# Patient Record
Sex: Female | Born: 1940 | Race: White | Hispanic: No | Marital: Married | Smoking: Former smoker
Health system: Southern US, Community
[De-identification: ages and names within clinical notes are randomized; demographics above are authoritative.]

## PROBLEM LIST (undated history)

## (undated) DIAGNOSIS — T7840XA Allergy, unspecified, initial encounter: Secondary | ICD-10-CM

## (undated) DIAGNOSIS — I809 Phlebitis and thrombophlebitis of unspecified site: Secondary | ICD-10-CM

## (undated) DIAGNOSIS — T8859XA Other complications of anesthesia, initial encounter: Secondary | ICD-10-CM

## (undated) DIAGNOSIS — M79606 Pain in leg, unspecified: Secondary | ICD-10-CM

## (undated) DIAGNOSIS — R112 Nausea with vomiting, unspecified: Secondary | ICD-10-CM

## (undated) DIAGNOSIS — T4145XA Adverse effect of unspecified anesthetic, initial encounter: Secondary | ICD-10-CM

## (undated) DIAGNOSIS — C50211 Malignant neoplasm of upper-inner quadrant of right female breast: Secondary | ICD-10-CM

## (undated) DIAGNOSIS — R0602 Shortness of breath: Secondary | ICD-10-CM

## (undated) DIAGNOSIS — R51 Headache: Secondary | ICD-10-CM

## (undated) DIAGNOSIS — M1990 Unspecified osteoarthritis, unspecified site: Secondary | ICD-10-CM

## (undated) DIAGNOSIS — C50919 Malignant neoplasm of unspecified site of unspecified female breast: Secondary | ICD-10-CM

## (undated) DIAGNOSIS — G8929 Other chronic pain: Secondary | ICD-10-CM

## (undated) DIAGNOSIS — Z9889 Other specified postprocedural states: Secondary | ICD-10-CM

## (undated) DIAGNOSIS — J449 Chronic obstructive pulmonary disease, unspecified: Secondary | ICD-10-CM

## (undated) HISTORY — PX: BREAST SURGERY: SHX581

## (undated) HISTORY — DX: Malignant neoplasm of upper-inner quadrant of right female breast: C50.211

## (undated) HISTORY — PX: APPENDECTOMY: SHX54

## (undated) HISTORY — PX: VARICOSE VEIN SURGERY: SHX832

## (undated) HISTORY — PX: TONSILLECTOMY: SUR1361

## (undated) HISTORY — DX: Shortness of breath: R06.02

## (undated) HISTORY — DX: Pain in leg, unspecified: M79.606

## (undated) HISTORY — DX: Unspecified osteoarthritis, unspecified site: M19.90

## (undated) HISTORY — DX: Other chronic pain: G89.29

## (undated) HISTORY — DX: Malignant neoplasm of unspecified site of unspecified female breast: C50.919

## (undated) HISTORY — DX: Chronic obstructive pulmonary disease, unspecified: J44.9

## (undated) HISTORY — DX: Phlebitis and thrombophlebitis of unspecified site: I80.9

## (undated) HISTORY — DX: Headache: R51

---

## 1997-10-14 ENCOUNTER — Other Ambulatory Visit: Admission: RE | Admit: 1997-10-14 | Discharge: 1997-10-14 | Payer: Self-pay | Admitting: *Deleted

## 2000-12-19 ENCOUNTER — Other Ambulatory Visit: Admission: RE | Admit: 2000-12-19 | Discharge: 2000-12-19 | Payer: Self-pay | Admitting: *Deleted

## 2001-01-22 ENCOUNTER — Encounter: Payer: Self-pay | Admitting: *Deleted

## 2001-01-22 ENCOUNTER — Encounter: Admission: RE | Admit: 2001-01-22 | Discharge: 2001-01-22 | Payer: Self-pay | Admitting: *Deleted

## 2001-01-23 ENCOUNTER — Encounter: Admission: RE | Admit: 2001-01-23 | Discharge: 2001-01-23 | Payer: Self-pay | Admitting: *Deleted

## 2001-01-23 ENCOUNTER — Encounter: Payer: Self-pay | Admitting: *Deleted

## 2001-05-23 ENCOUNTER — Ambulatory Visit (HOSPITAL_COMMUNITY): Admission: RE | Admit: 2001-05-23 | Discharge: 2001-05-23 | Payer: Self-pay | Admitting: Gastroenterology

## 2001-07-11 ENCOUNTER — Encounter: Payer: Self-pay | Admitting: *Deleted

## 2001-07-11 ENCOUNTER — Encounter: Admission: RE | Admit: 2001-07-11 | Discharge: 2001-07-11 | Payer: Self-pay | Admitting: *Deleted

## 2002-07-25 ENCOUNTER — Other Ambulatory Visit: Admission: RE | Admit: 2002-07-25 | Discharge: 2002-07-25 | Payer: Self-pay | Admitting: *Deleted

## 2002-09-03 ENCOUNTER — Encounter: Admission: RE | Admit: 2002-09-03 | Discharge: 2002-09-03 | Payer: Self-pay | Admitting: *Deleted

## 2004-07-01 ENCOUNTER — Encounter: Admission: RE | Admit: 2004-07-01 | Discharge: 2004-07-01 | Payer: Self-pay | Admitting: Family Medicine

## 2006-02-14 ENCOUNTER — Encounter: Admission: RE | Admit: 2006-02-14 | Discharge: 2006-02-14 | Payer: Self-pay | Admitting: Family Medicine

## 2006-02-20 ENCOUNTER — Encounter: Admission: RE | Admit: 2006-02-20 | Discharge: 2006-02-20 | Payer: Self-pay | Admitting: Family Medicine

## 2006-06-15 ENCOUNTER — Encounter: Admission: RE | Admit: 2006-06-15 | Discharge: 2006-06-15 | Payer: Self-pay | Admitting: Family Medicine

## 2006-07-14 LAB — HM COLONOSCOPY

## 2007-05-21 ENCOUNTER — Encounter: Admission: RE | Admit: 2007-05-21 | Discharge: 2007-05-21 | Payer: Self-pay | Admitting: Family Medicine

## 2008-07-01 ENCOUNTER — Encounter: Admission: RE | Admit: 2008-07-01 | Discharge: 2008-07-01 | Payer: Self-pay | Admitting: Family Medicine

## 2010-05-05 ENCOUNTER — Other Ambulatory Visit: Payer: Self-pay | Admitting: Family Medicine

## 2010-05-05 DIAGNOSIS — Z1231 Encounter for screening mammogram for malignant neoplasm of breast: Secondary | ICD-10-CM

## 2010-05-05 DIAGNOSIS — Z1239 Encounter for other screening for malignant neoplasm of breast: Secondary | ICD-10-CM

## 2010-05-10 ENCOUNTER — Ambulatory Visit
Admission: RE | Admit: 2010-05-10 | Discharge: 2010-05-10 | Payer: Self-pay | Source: Home / Self Care | Attending: Vascular Surgery | Admitting: Vascular Surgery

## 2010-05-10 ENCOUNTER — Ambulatory Visit: Admit: 2010-05-10 | Payer: Self-pay | Admitting: Vascular Surgery

## 2010-05-10 NOTE — Assessment & Plan Note (Signed)
OFFICE VISIT  Geske, Tenea A DOB:  08-Feb-1941                                       05/10/2010 [id number]#:[id number]  This is a new patient consultation.  The patient is a 70 year old female referred for severe venous insufficiency of both lower extremities with a recent episode of superficial thrombophlebitis in her right calf. This patient has had varicose veins for 40 years which have become increasingly symptomatic, causing aching, throbbing and burning discomfort in both legs the more she is on her feet.  She had an episode of superficial thrombophlebitis which occurred in December of 2011 and has been treated by short leg elastic compression stockings and aspirin with gradual improvement but not a resolution.  She has never had treatment for her further varicosities, has no history of stasis ulcers or bleeding but does develop some edema in the ankles as the day progresses.  Symptoms have been worsening over the years to the point that she requires elevation of the legs at times to relieve her symptoms.  CHRONIC MEDICAL PROBLEMS: 1. COPD. 2. Glaucoma. 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 4. Negative for coronary artery disease, diabetes, hypertension,     hyperlipidemia or stroke.  SOCIAL HISTORY:  She is married, has 3 children, is retired.  Has not smoked cigarettes in 20 years.  Drinks occasional alcohol.  FAMILY HISTORY:  Positive for coronary artery disease in her father who died at age 84.  Positive for stroke in a grandfather.  Negative for diabetes.  REVIEW OF SYSTEMS:  Positive for headaches, changes in hearing.  Denies any claudication symptoms in her legs.  States she could walk a mile if she so chose.  Does have arthritis joint pain, dyspnea on exertion.  All other systems in a complete review of systems are negative.  PHYSICAL EXAM:  Vital signs:  Blood pressure 147/79, heart rate 71, respirations 18.  General:  She is a  well-developed, well-nourished female in no apparent distress, alert and oriented x3.  HEENT:  Normal for age.  EOMs intact.  Lungs:  Clear to auscultation.  No rhonchi or wheezing.  Cardiovascular:  Reveals regular rhythm.  No murmurs. Carotid pulses are 3+, no audible bruits.  Abdomen:  Soft, nontender with no masses.  Musculoskeletal:  Free of major deformities. Neurologic:  Normal.  Skin:  Reveals dry scaly skin in both lower extremities.  She has bulging varicosities in the left leg beginning at the proximal thigh extending anteriorly across the thigh anterior to the knee and down in the pretibial region to the ankle.  Right leg has superficial thrombophlebitis involving bulging varicosities in the right medial calf and the right posterior calf.  She has some hyperpigmentation in both lower extremities with no edema and no active ulceration.  She has 3+ femoral, 2+ popliteal and 1 to 2+ posterior tibial pulses bilaterally.  Today I ordered venous duplex exam which I reviewed and interpreted. Right leg has gross reflux in the right great saphenous vein from the saphenofemoral junction to the calf.  Deep system has reflux but no DVT. Left leg has similarly gross reflux in the great saphenous vein from the knee to the saphenofemoral junction with large tortuous varicosities emanating from this.  This patient has severe venous insufficiency in both legs and does need treatment.  We will treat her initially with long leg elastic  compression stockings (20 mm - 30 mm gradient) as well as elevation and ibuprofen.  She will return in 3 months.  If there has been no improvement she should have: 1. Laser ablation of the right great saphenous vein. 2. Laser ablation of the left great saphenous vein. 3. Return in 3 months for probable staged stab phlebectomies in both     lower extremities.  She will return in 3 months for further evaluation to see if she has had any  improvement.    Quita Skye Hart [place], M.D. Electronically Signed  JDL/MEDQ  D:  05/10/2010  T:  05/10/2010  Job:  [id number]  cc:   Elizabeth Palau, NP

## 2010-05-13 ENCOUNTER — Ambulatory Visit: Payer: Self-pay

## 2010-05-17 NOTE — Procedures (Unsigned)
LOWER EXTREMITY VENOUS REFLUX EXAM  INDICATION:  Varicose veins with pain and superficial clot in right calf 12/11.  EXAM:  Using color-flow imaging and pulse Doppler spectral analysis, the right and left common femoral, superficial femoral, popliteal, posterior tibial, greater and lesser saphenous veins were evaluated.  There is evidence suggesting deep venous insufficiency in the right and left lower extremities.  The right and left saphenofemoral junctions are not competent with reflux of >500 milliseconds.  The right and left GSVs are not competent with reflux of >500 milliseconds with the caliber as described below.  The right and left proximal short saphenous veins demonstrate competency.  GSV Diameter (used if found to be incompetent only)                                           Right    Left Proximal Greater Saphenous Vein           1.43 cm  1.1 cm Proximal-to-mid-thigh                     cm       cm Mid thigh                                 0.75 cm  0.46 cm Mid-distal thigh                          cm       cm Distal thigh                              0.76 cm  0.44 cm Knee                                      0.7 cm   0.48 cm  IMPRESSION: 1. Right and left greater saphenous veins are not competent with     reflux of >500 milliseconds. 2. Right and left greater saphenous veins are not tortuous. 3. The deep venous system is not competent with reflux of >500     milliseconds. 4. The right and left short saphenous veins are competent.           ___________________________________________ Quita Skye. [facility] [place], M.D.  AS/MEDQ  D:  05/10/2010  T:  05/10/2010  Job:  [id number]

## 2010-07-05 ENCOUNTER — Ambulatory Visit: Payer: Self-pay

## 2010-07-07 ENCOUNTER — Ambulatory Visit: Payer: Self-pay

## 2010-07-29 ENCOUNTER — Other Ambulatory Visit: Payer: Self-pay | Admitting: Family Medicine

## 2010-08-03 ENCOUNTER — Ambulatory Visit: Payer: Self-pay | Admitting: Vascular Surgery

## 2010-08-03 ENCOUNTER — Ambulatory Visit (INDEPENDENT_AMBULATORY_CARE_PROVIDER_SITE_OTHER): Payer: Medicare Other | Admitting: Vascular Surgery

## 2010-08-03 DIAGNOSIS — I83893 Varicose veins of bilateral lower extremities with other complications: Secondary | ICD-10-CM

## 2010-08-04 ENCOUNTER — Ambulatory Visit
Admission: RE | Admit: 2010-08-04 | Discharge: 2010-08-04 | Disposition: A | Discharge status: Home / Self Care | Payer: Medicare Other | Source: Ambulatory Visit | Attending: Family Medicine | Admitting: Family Medicine

## 2010-08-04 DIAGNOSIS — Z1231 Encounter for screening mammogram for malignant neoplasm of breast: Secondary | ICD-10-CM

## 2010-08-04 NOTE — Assessment & Plan Note (Signed)
OFFICE VISIT  Pierpoint, Teren A DOB:  07/05/1940                                       08/03/2010 [id number]#:[id number]  Patient returns today for follow-up regarding her venous insufficiency of both lower extremities.  She was evaluated by me in January for this problem, having had a recent episode of superficial thrombophlebitis in her right calf.  She was found to have severe reflux in both great saphenous systems.  No reflux in the small saphenous system.  She had been having aching, throbbing, and burning discomfort in both legs over the last few years, which have increased in severity, but no history of DVT.  She has been wearing her long-leg elastic compression stockings (20-30 mm gradient) as well as trying elevation and ibuprofen on a regular basis with no improvement in her symptoms.  Her thrombophlebitis has calmed down so that there is less tenderness in the right calf.  On exam today, her blood pressure is 185/82.  Heart rate is 108. Respirations are 24.  Right calf has some mild tenderness posteriorly where the thrombophlebitis had occurred.  She has bulging varicosities in the left leg beginning in the mid thigh, extending down into the pretibial region, which are quite tense and large.  Today I visualized both great saphenous veins and again they reveal gross reflux throughout.  I think the best plan for this lady to treat her symptoms would be:  1. Laser ablation of the right great saphenous vein.  2.  Laser ablation of the left great saphenous vein.  3.  Return in 3 months to be evaluated for possible stab phlebectomies.  We will proceed in the near future with her treatment plan.    Quita Skye Hart [place], M.D. Electronically Signed  JDL/MEDQ  D:  08/03/2010  T:  08/04/2010  Job:  [id number]

## 2010-08-27 NOTE — Op Note (Signed)
. [facility]  Patient:    REFUGIO, MCCONICO Visit Number: [id number] MRN: [id number]          Service Type: END Location: ENDO Attending Physician:  Charna Elizabeth Dictated by:   Anselmo Rod, M.D. Proc. Date: 05/23/01   CC:         Heather Roberts, M.D.   Operative Report  DATE OF BIRTH:  01/28/1941  REFERRING PHYSICIAN:  Heather Roberts, M.D.  PROCEDURE PERFORMED:  Flexible sigmoidoscopy up to 90 cm.  ENDOSCOPIST:  Anselmo Rod, M.D.  INSTRUMENT USED:  Olympus video colonoscope.  INDICATIONS FOR PROCEDURE:  Screening flexible sigmoidoscopy being performed in a 70 year old white female rule out colonic polyps, masses, etc.  PREPROCEDURE PREPARATION:  Informed consent was procured from the patient. The patient was fasted for eight hours prior to the procedure and prepped with two bottles of Fleets Phospho-Soda prior to the procedure.  PREPROCEDURE PHYSICAL:  The patient had stable vital signs.  Neck supple. Chest clear to auscultation.  S1, S2 regular.  Abdomen soft with normal bowel sounds.  DESCRIPTION OF PROCEDURE:  The patient was placed in the left lateral decubitus position and sedated with 50 mg of Demerol and 5 mg of Versed intravenously.  Once the patient was adequately sedated and maintained on low-flow oxygen and continuous cardiac monitoring, the Olympus video colonoscope was advanced from the rectum to 90 cm without difficulty.  No masses, polyps, erosions, ulcers or diverticula were seen.  There was a large amount of stool at 90 cm and the scope could not be advanced beyond that point.  Small internal hemorrhoids were appreciated on retroflexion in the rectum.  The patient tolerated the procedure well without complications.  IMPRESSION:   Healthy-appearing colon up to 90 cm except for small nonbleeding internal hemorrhoids.  RECOMMENDATIONS: 1. Repeat colorectal cancer screening is recommended in the next five  years    unless the patient were to develop any abnormal symptoms in the interim. 2. A high fiber diet has been advised along with liberal fluid intake.Dictated y:   Anselmo Rod, M.D. Attending Physician:  Charna Elizabeth DD:  05/23/01 TD:  05/23/01 Job: [id number]

## 2010-09-13 ENCOUNTER — Other Ambulatory Visit (INDEPENDENT_AMBULATORY_CARE_PROVIDER_SITE_OTHER): Payer: Medicare Other | Admitting: Vascular Surgery

## 2010-09-13 DIAGNOSIS — I83893 Varicose veins of bilateral lower extremities with other complications: Secondary | ICD-10-CM

## 2010-09-13 NOTE — Assessment & Plan Note (Signed)
OFFICE VISIT  Fuller, Kelsey A DOB:  Jul 31, 1940                                       09/13/2010 [id number]#:[id number]  Patient had laser ablation of her right great saphenous vein from the proximal calf to the saphenofemoral junction under local tumescent anesthesia.  She tolerated the procedure well.  She will return in 1 week for a venous duplex exam to confirm closure of the right great saphenous vein.    Quita Skye Hart [place], M.D. Electronically Signed  JDL/MEDQ  D:  09/13/2010  T:  09/13/2010  Job:  [id number]

## 2010-09-21 ENCOUNTER — Encounter (INDEPENDENT_AMBULATORY_CARE_PROVIDER_SITE_OTHER): Payer: Medicare Other

## 2010-09-21 ENCOUNTER — Ambulatory Visit (INDEPENDENT_AMBULATORY_CARE_PROVIDER_SITE_OTHER): Payer: Medicare Other | Admitting: Vascular Surgery

## 2010-09-21 DIAGNOSIS — I83893 Varicose veins of bilateral lower extremities with other complications: Secondary | ICD-10-CM

## 2010-09-21 DIAGNOSIS — Z48812 Encounter for surgical aftercare following surgery on the circulatory system: Secondary | ICD-10-CM

## 2010-09-21 NOTE — Assessment & Plan Note (Signed)
OFFICE VISIT  Allegretto, Kelsey Fuller DOB:  Jan 30, 1941                                       09/21/2010 [id number]#:[id number]  Patient returns 1 week post laser ablation of her right great saphenous vein for painful varicosities secondary to valvular reflux in her right great system.  She has had no edema in the leg.  She has had some moderate discomfort and ecchymosis in the right thigh where the ablation was performed but no calf tenderness or distal swelling.  She continues to have bulging varicosities in the left leg which she is treating with long-leg elastic compression stockings and will be treated in the near future.  She denies any chest pain, dyspnea on exertion, PND, orthopnea, or hemoptysis.  CHRONIC MEDICAL PROBLEMS, STABLE: 1. COPD. 2. Glaucoma. 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  SOCIAL HISTORY:  She is married, has 3 children, is retired.  Has not smoked in 20 years.  Drinks occasional alcohol.  PHYSICAL EXAMINATION:  Blood pressure is 184/85, heart rate 71, respirations 24.  Generally, Fuller well-developed, well-nourished female in no apparent distress.  Abdomen:  Soft, nontender without masses.  Lower extremity exam on the right reveals some moderate ecchymosis in the thigh, mild tenderness along the course of the great saphenous vein.  No distal edema.  Today I ordered Fuller venous duplex exam which I reviewed and interpreted and also performed Fuller bedside SonoSite ultrasound exam.  She does have successful ablation of the right GSV with some slight bulging into the common femoral vein, which was nonocclusive.  I have asked her to continue taking her aspirin daily.  She will return in 2 weeks for Fuller similar procedure in the left leg.    Quita Skye [facility] [place], M.D. Electronically Signed  JDL/MEDQ  D:  09/21/2010  T:  09/21/2010  Job:  [id number]

## 2010-09-28 NOTE — Procedures (Unsigned)
DUPLEX DEEP VENOUS EXAM - LOWER EXTREMITY  INDICATION:  Followup right GSV ablation performed 09/13/2010.  HISTORY:  Edema:  Minimal. Trauma/Surgery:  Right ablation. Pain:  Yes. PE:  No. Previous DVT:  No. Anticoagulants: Other:  DUPLEX EXAM:               CFV   SFV   PopV  PTV    GSV               R  L  R  L  R  L  R   L  R  L Thrombosis    +     o     o     o      + Spontaneous   +     +     +     +      o Phasic        +     +     +     +      o Augmentation  +     +     +     +      o Compressible  P     +     +     +      o Competent     +     +     +     +      o  Legend:  + - yes  o - no  p - partial  D - decreased  IMPRESSION:  Successful ablation of right greater saphenous vein with slight extension of thrombus into the common femoral vein which appears nonocclusive.   _____________________________ Quita Skye. Hart [place], M.D.  LT/MEDQ  D:  09/21/2010  T:  09/21/2010  Job:  [id number]

## 2010-10-04 ENCOUNTER — Other Ambulatory Visit (INDEPENDENT_AMBULATORY_CARE_PROVIDER_SITE_OTHER): Payer: Medicare Other | Admitting: Vascular Surgery

## 2010-10-04 DIAGNOSIS — I83893 Varicose veins of bilateral lower extremities with other complications: Secondary | ICD-10-CM

## 2010-10-04 NOTE — Assessment & Plan Note (Signed)
OFFICE VISIT  Cullinan, Oceanna A DOB:  1941-04-07                                       10/05/2010 [id number]#:[id number]  The patient had laser ablation of her left great saphenous vein for painful varicosities secondary to valvular incompetence in the left great saphenous system.  She tolerated the procedure well which was done under local tumescent anesthesia.  She will return in 1 week for followup duplex scan to confirm closure of left great saphenous vein.    Quita Skye Hart [place], M.D. Electronically Signed  JDL/MEDQ  D:  10/04/2010  T:  10/04/2010  Job:  [id number]

## 2010-10-11 ENCOUNTER — Ambulatory Visit (INDEPENDENT_AMBULATORY_CARE_PROVIDER_SITE_OTHER): Payer: Medicare Other | Admitting: Vascular Surgery

## 2010-10-11 ENCOUNTER — Encounter (INDEPENDENT_AMBULATORY_CARE_PROVIDER_SITE_OTHER): Payer: Medicare Other

## 2010-10-11 DIAGNOSIS — I8310 Varicose veins of unspecified lower extremity with inflammation: Secondary | ICD-10-CM

## 2010-10-11 DIAGNOSIS — Z48812 Encounter for surgical aftercare following surgery on the circulatory system: Secondary | ICD-10-CM

## 2010-10-11 DIAGNOSIS — I83893 Varicose veins of bilateral lower extremities with other complications: Secondary | ICD-10-CM

## 2010-10-12 NOTE — Assessment & Plan Note (Signed)
OFFICE VISIT  Fuller, Kelsey A DOB:  [birth year]                                       10/11/2010 [id number]#:[id number]  The patient returns today 1 week post laser ablation of the left great saphenous vein for valvular reflux and secondary painful varicosities in the left thigh and calf and pretibial region.  She tolerated the procedure well and has been wearing elastic compression stocking and taking ibuprofen as prescribed.  She has had no distal edema in the left ankle and has noted that the severe varicosities below the knee are less tense but still quite visible.  She denies any chest pain, dyspnea on exertion, PND, orthopnea, hemoptysis or other secondary symptoms.  CHRONIC MEDICAL PROBLEMS: 1. COPD. 2. Glaucoma. 3. Diffuse arthritis and lumbar spine disease. 4. Negative for coronary artery disease, diabetes or stroke.  REVIEW OF SYSTEMS:  As noted with no chest pain, hemoptysis, dyspnea on exertion, PND, orthopnea.  PHYSICAL EXAM:  Vital signs:  Blood pressure is 174/82, heart rate 71, respirations 24.  General:  She is a thin female who is no apparent distress, alert and oriented x3.  HEENT:  Exam normal for age.  EOMs intact.  Lungs:  Clear to auscultation.  No rhonchi or wheezing. Cardiovascular:  Regular rhythm.  No murmurs.  Carotid pulses 3+.  No bruits.  Lower extremities:  Exam reveals 3+ femoral and dorsalis pedis pulses bilaterally.  Left leg has very visible varicosities beginning in the distal medial thigh extending in the pretibial region to the ankle but no distal edema is noted and these are less tense than prior to the ablation.  Today I ordered a venous duplex exam which I reviewed and interpreted. She does have complete closure of the left great saphenous vein with some bulging of the thrombus into the left common femoral vein.  I also looked at this personally with the SonoSite and it was nonocclusive.  She will continue taking  daily aspirin and return in 3 months for venous duplex exam of the left leg unless she should develop any severe swelling in the interim.  At that point we will evaluate her for possible stab phlebectomies for the secondary varicosities.    Quita Skye Hart [place], M.D. Electronically Signed  JDL/MEDQ  D:  10/11/2010  T:  10/12/2010  Job:  [id number]

## 2010-11-09 ENCOUNTER — Encounter: Payer: Self-pay | Admitting: Vascular Surgery

## 2011-01-10 ENCOUNTER — Encounter: Payer: Self-pay | Admitting: Vascular Surgery

## 2011-01-11 ENCOUNTER — Encounter: Payer: Self-pay | Admitting: Vascular Surgery

## 2011-01-11 ENCOUNTER — Encounter (INDEPENDENT_AMBULATORY_CARE_PROVIDER_SITE_OTHER): Payer: Medicare Other | Admitting: *Deleted

## 2011-01-11 ENCOUNTER — Ambulatory Visit (INDEPENDENT_AMBULATORY_CARE_PROVIDER_SITE_OTHER): Payer: Medicare Other | Admitting: Vascular Surgery

## 2011-01-11 VITALS — BP 144/74 | HR 79 | Resp 20 | Ht 63.0 in | Wt 120.0 lb

## 2011-01-11 DIAGNOSIS — Z48812 Encounter for surgical aftercare following surgery on the circulatory system: Secondary | ICD-10-CM

## 2011-01-11 DIAGNOSIS — I8310 Varicose veins of unspecified lower extremity with inflammation: Secondary | ICD-10-CM

## 2011-01-11 DIAGNOSIS — I83893 Varicose veins of bilateral lower extremities with other complications: Secondary | ICD-10-CM

## 2011-01-11 NOTE — Progress Notes (Signed)
Subjective:     Patient ID: Kelsey Fuller, female   DOB: 1940-09-11, 70 y.o.   MRN: [id number]  HPI this 70 year old female returns for continued followup regarding her bilateral venous insufficiency. She has undergone bilateral laser ablation of the great saphenous veins-right side September 13 2010 left-sided 10/04/2010. She had some thrombus slightly protruding into the common femoral vein on the left side following her left laser ablation. She has had no DVT in the past. Today I ordered a venous duplex exam which I reviewed and interpreted. There is no DVT on the left side and there is no longer thrombus protruding into the femoral vein. The majority of the GSC is closed with the exception of near the junction. He has continued to have some aching and throbbing discomfort in the calves left greater than right following her ablation procedures. Bulges are not as significant as prior to the ablation but continued to be present and pretty extensive on the left side from the thigh extending down to the ankle  Review of Systems denies chest pain, PND, orthopnea,. Does complain of dyspnea on exertion, weight gain, dizziness, headaches, change in hearing, arthritis, wheezing due to her COPD    Objective:   Physical Exam blood pressure 144/74 heart rate 79 respirations 20 General she's well-developed thin female who is no apparent stress alert oriented x3 Lower extremity exam no 3+ femoral popliteal dorsalis pedis pulses bilaterally She has bulging varicosities in the left leg beginning in the proximal thigh extending into the pretibial region extending down to the ankle. Right leg varicosities are in the posterior calf and extending down toward the medial malleolus    Assessment:    successful bilateral laser ablation greater saphenous veins-remains symptomatic with secondary varicosities. This has not been relieved by long-leg last compression stockings nor elevation and ibuprofen.    Plan:    plan greater  than 20 stab phlebectomy's left leg plus sclerotherapy of residual varicosities right leg is final procedure next week

## 2011-01-11 NOTE — Progress Notes (Signed)
3 month fu laser ablation. Duplex performed. Eval for stab phlebs.

## 2011-01-14 ENCOUNTER — Encounter: Payer: Self-pay | Admitting: Vascular Surgery

## 2011-01-17 ENCOUNTER — Encounter: Payer: Self-pay | Admitting: Vascular Surgery

## 2011-01-17 ENCOUNTER — Ambulatory Visit (INDEPENDENT_AMBULATORY_CARE_PROVIDER_SITE_OTHER): Payer: Medicare Other | Admitting: Vascular Surgery

## 2011-01-17 VITALS — BP 172/80 | HR 72 | Resp 20 | Ht 64.0 in | Wt 120.0 lb

## 2011-01-17 DIAGNOSIS — I83893 Varicose veins of bilateral lower extremities with other complications: Secondary | ICD-10-CM

## 2011-01-17 NOTE — Progress Notes (Signed)
Stab phlebs. And sclero

## 2011-01-17 NOTE — Progress Notes (Signed)
Subjective:     Patient ID: Kelsey Fuller, female   DOB: 22-Mar-1941, 70 y.o.   MRN: [id number]  HPI this 70 year old female underwent greater than 71 stab phlebectomy is in the left anterior thigh and anterior pretibial calf area. She had previously undergone laser ablation of her left great saphenous vein a few months ago. He also had sclerotherapy of secondary varicosities in the right leg today. This was all done under local medicine anesthesia on the left and no anesthesia on the right. He tolerated both procedures well   Review of Systems     Objective:   Physical Exam blood pressure 170/80 heart rate 72 respirations 20    Assessment:    successful stab phlebectomy strength is greater than 20) left leg of residual painful varicosities .successful sclerotherapy of secondary varicosities right leg    Plan:    elastic compression stockings for 2 weeks and return to see [place] for final followup in 6 weeks

## 2011-01-17 NOTE — Progress Notes (Signed)
Subjective:     Patient ID: Kelsey Fuller, female   DOB: Jan 24, 1941, 70 y.o.   MRN: [id number]  HPI  X=.3%Sotradecol administered with a 27g butterfly.  Patient received a total of 6cc foam.   Compression stockings applied: yes and ace wraps   Review of Systems     Objective:   Physical Exam     Assessment:     Injected reticular and spider veins that were too small for stab phlebectomies. Pt. Tolerated procedure well.   Plan:     She is to return in 6 weeks.

## 2011-01-18 ENCOUNTER — Telehealth: Payer: Self-pay | Admitting: *Deleted

## 2011-01-18 NOTE — Telephone Encounter (Signed)
01/18/2011  Time: 9:55 AM   Patient Name: Kelsey Fuller  Patient of: Hart Rochester  Procedure:37766 L leg and 44010 on R leg  Reached patient at home. Doing well. No bleeding. Will see her back in 6 weeks.

## 2011-01-25 ENCOUNTER — Encounter: Payer: Self-pay | Admitting: Vascular Surgery

## 2011-02-18 ENCOUNTER — Encounter: Payer: Self-pay | Admitting: Vascular Surgery

## 2011-02-21 ENCOUNTER — Encounter: Payer: Self-pay | Admitting: Vascular Surgery

## 2011-02-21 ENCOUNTER — Ambulatory Visit (INDEPENDENT_AMBULATORY_CARE_PROVIDER_SITE_OTHER): Payer: Medicare Other | Admitting: Vascular Surgery

## 2011-02-21 VITALS — BP 135/76 | HR 90 | Resp 18 | Ht 63.0 in | Wt 120.0 lb

## 2011-02-21 DIAGNOSIS — I83893 Varicose veins of bilateral lower extremities with other complications: Secondary | ICD-10-CM

## 2011-02-21 NOTE — Progress Notes (Signed)
Subjective:     Patient ID: Kelsey Fuller, female   DOB: 1940-09-01, 70 y.o.   MRN: [id number]  HPI this 70 year old female had greater than 20 stab phlebectomy performed 01/17/2011 for painful residual varicosities. She also had sclerotherapy of the right leg residual small varicosities. She states that her discomfort in the lower extremities has resolved and she is having no distal edema. She is not worn elastic compression stockings and is having no symptoms in either leg at this time.   Review of Systems     Objective:   Physical Exam blood pressure 130/76 heart rate 90 respirations 18 Lower extremity exam reveals no distal edema in either leg. She does have a few residual varicosities in the left leg around the pretibial knee area. There is no active ulceration or bulging varicosities in the medial thigh or distal calf.    Assessment:     Doing well post bilateral great saphenous vein laser ablation procedures and greater 20 stab phlebectomy left leg and sclerotherapy right leg.    Plan:     Return on when necessary basis

## 2011-03-18 ENCOUNTER — Encounter: Payer: Self-pay | Admitting: Vascular Surgery

## 2011-12-20 ENCOUNTER — Encounter: Payer: Self-pay | Admitting: Cardiovascular Disease

## 2011-12-22 ENCOUNTER — Encounter: Payer: Self-pay | Admitting: Cardiovascular Disease

## 2012-07-11 ENCOUNTER — Encounter: Payer: Medicare Other | Admitting: Cardiovascular Disease

## 2012-07-17 ENCOUNTER — Encounter: Payer: Self-pay | Admitting: Cardiovascular Disease

## 2012-07-19 ENCOUNTER — Ambulatory Visit (INDEPENDENT_AMBULATORY_CARE_PROVIDER_SITE_OTHER): Payer: Medicare Other | Admitting: Cardiovascular Disease

## 2012-07-19 ENCOUNTER — Encounter: Payer: Self-pay | Admitting: Cardiovascular Disease

## 2012-07-19 VITALS — BP 126/86 | HR 84 | Ht 63.0 in | Wt 118.4 lb

## 2012-07-19 DIAGNOSIS — R9431 Abnormal electrocardiogram [ECG] [EKG]: Secondary | ICD-10-CM

## 2012-07-19 DIAGNOSIS — R0600 Dyspnea, unspecified: Secondary | ICD-10-CM | POA: Insufficient documentation

## 2012-07-19 DIAGNOSIS — Z8249 Family history of ischemic heart disease and other diseases of the circulatory system: Secondary | ICD-10-CM | POA: Insufficient documentation

## 2012-07-19 DIAGNOSIS — J449 Chronic obstructive pulmonary disease, unspecified: Secondary | ICD-10-CM | POA: Insufficient documentation

## 2012-07-19 DIAGNOSIS — R0989 Other specified symptoms and signs involving the circulatory and respiratory systems: Secondary | ICD-10-CM

## 2012-07-19 NOTE — Progress Notes (Signed)
Kelsey Fuller Date of Birth  [birth year]       [facility]    [place] [address]  [address] [place], [place]  [address]   [place], [place]  [address] [phone]     [phone]   Fax  [phone]    Fax [phone]  Problem List: 1. Abnormal ECG 2. COPD 3. Family history of cardiomyopathy.   History of Present Illness:  Kelsey Fuller is a 72 yo who I have seen in the past.  She has chronic dyspnea that she thinks is due to COPD but has had a sister and her mother die of complications due to cardiomyopathy.    She denies any chest pain.  She is chronically short of breath.    I saw her in  2009 for evaluation of dyspnea.  She had a Lexiscan Myoview study at that time which revealed no ischemia. She had an echocardiogram which revealed normal left ventricular systolic function. She did have mild diastolic dysfunction.   Current Outpatient Prescriptions on File Prior to Visit  Medication Sig Dispense Refill  . albuterol (PROVENTIL) (2.5 MG/3ML) 0.083% nebulizer solution Take 2.5 mg by nebulization every 6 (six) hours as needed.        Marland Kitchen FLAXSEED, LINSEED, PO Take by mouth daily.       . Multiple Vitamin (MULTIVITAMIN) capsule Take 1 capsule by mouth daily.        Marland Kitchen tiotropium (SPIRIVA) 18 MCG inhalation capsule Place 18 mcg into inhaler and inhale daily.         No current facility-administered medications on file prior to visit.    Allergies  Allergen Reactions  . Codeine   . Penicillins     Past Medical History  Diagnosis Date  . Chronic headaches   . Asthma   . Arthritis   . Shortness of breath on exertion   . Leg pain   . COPD (chronic obstructive pulmonary disease)   . Osteoporosis   . Glaucoma(365)   . Thrombophlebitis     Past Surgical History  Procedure Laterality Date  . Breast surgery      History  Smoking status  . Former Smoker -- 0.50 packs/day for 20 years  . Types: Cigarettes  . Quit date:  04/12/1975  Smokeless tobacco  . Never Used    History  Alcohol Use  . 0.5 oz/week  . 1 drink(s) per week    Family History  Problem Relation Age of Onset  . Diabetes Mother   . Heart disease Mother   . Hypertension Mother   . Heart disease Father     Reviw of Systems:  Reviewed in the HPI.  All other systems are negative.  Physical Exam: Blood pressure 126/86, pulse 84, height 5\' 3"  (1.6 m), weight 118 lb 6.4 oz (53.706 kg). General: Well developed, well nourished, in no acute distress.  Head: Normocephalic, atraumatic, sclera non-icteric, mucus membranes are moist,   Neck: Supple. Carotids are 2 + without bruits. No JVD   Lungs: Clear   Heart: RR, normal S1, S2, soft systolic murmur  Abdomen: Soft, non-tender, non-distended with normal bowel sounds.  Msk:  Strength and tone are normal   Extremities: No clubbing or cyanosis. No edema.  Distal pedal pulses are 2+ and equal    Neuro: CN II - XII intact.  Alert and oriented X 3.   Psych:  Normal   ECG: 07/19/2012: Normal sinus rhythm at  83. She has ST and T wave abnormalities. She has an incomplete left bundle branch block.   Assessment / Plan:

## 2012-07-19 NOTE — Patient Instructions (Addendum)
Your physician has requested that you have a lexiscan myoview.  Please follow instruction sheet, as given.   Your physician has requested that you have an echocardiogram. Echocardiography is a painless test that uses sound waves to create images of your heart. It provides your doctor with information about the size and shape of your heart and how well your heart's chambers and valves are working. This procedure takes approximately one hour. There are no restrictions for this procedure.  Your physician wants you to follow-up in: 1 YEAR You will receive a reminder letter in the mail two months in advance. If you don't receive a letter, please call our office to schedule the follow-up appointment.   Your physician recommends that you continue on your current medications as directed. Please refer to the Current Medication list given to you today.

## 2012-07-19 NOTE — Assessment & Plan Note (Signed)
Kelsey Fuller presents today for further evaluation of her dyspnea. She also has a family history of cardiomyopathy. A tear about 5 years ago.  She presents today for further evaluation. She hasn't an abnormal EKG which reveals an incomplete left bundle branch block. She has a T-wave inversions in lead aVL and V5 and V6.  She thinks that her shortness of breath may be due to COPD but she's not clear. Her sister recently died of a cardiomyopathy. Her sister's doctor informed to rest the family that they should be checked for cardiomyopathy.  We'll schedule her for an echocardiogram for further evaluation of her left ventricular size and function. We'll also get a [employer] for further evaluation of her abnormal ECG.  I'll plan on seeing her again in one year.

## 2012-07-24 ENCOUNTER — Ambulatory Visit (HOSPITAL_COMMUNITY): Payer: Medicare Other | Attending: Cardiology

## 2012-07-24 DIAGNOSIS — R0600 Dyspnea, unspecified: Secondary | ICD-10-CM

## 2012-07-24 DIAGNOSIS — R9431 Abnormal electrocardiogram [ECG] [EKG]: Secondary | ICD-10-CM

## 2012-07-24 DIAGNOSIS — R0989 Other specified symptoms and signs involving the circulatory and respiratory systems: Secondary | ICD-10-CM | POA: Insufficient documentation

## 2012-07-24 DIAGNOSIS — R0609 Other forms of dyspnea: Secondary | ICD-10-CM

## 2012-07-24 NOTE — Progress Notes (Signed)
Echocardiogram performed.  

## 2012-07-30 ENCOUNTER — Ambulatory Visit (HOSPITAL_COMMUNITY): Payer: Medicare Other | Attending: Cardiovascular Disease | Admitting: Radiology

## 2012-07-30 VITALS — BP 159/79 | Ht 63.0 in | Wt 116.0 lb

## 2012-07-30 DIAGNOSIS — R9431 Abnormal electrocardiogram [ECG] [EKG]: Secondary | ICD-10-CM | POA: Insufficient documentation

## 2012-07-30 DIAGNOSIS — R0600 Dyspnea, unspecified: Secondary | ICD-10-CM

## 2012-07-30 DIAGNOSIS — Z87891 Personal history of nicotine dependence: Secondary | ICD-10-CM | POA: Insufficient documentation

## 2012-07-30 DIAGNOSIS — J4489 Other specified chronic obstructive pulmonary disease: Secondary | ICD-10-CM | POA: Insufficient documentation

## 2012-07-30 DIAGNOSIS — J449 Chronic obstructive pulmonary disease, unspecified: Secondary | ICD-10-CM | POA: Insufficient documentation

## 2012-07-30 DIAGNOSIS — R0602 Shortness of breath: Secondary | ICD-10-CM | POA: Insufficient documentation

## 2012-07-30 DIAGNOSIS — Z8249 Family history of ischemic heart disease and other diseases of the circulatory system: Secondary | ICD-10-CM | POA: Insufficient documentation

## 2012-07-30 MED ORDER — REGADENOSON 0.4 MG/5ML IV SOLN
0.4000 mg | Freq: Once | INTRAVENOUS | Status: AC
  Administered 2012-07-30: 0.4 mg via INTRAVENOUS

## 2012-07-30 MED ORDER — TECHNETIUM TC 99M SESTAMIBI GENERIC - CARDIOLITE
30.0000 | Freq: Once | INTRAVENOUS | Status: AC | PRN
  Administered 2012-07-30: 30 via INTRAVENOUS

## 2012-07-30 MED ORDER — TECHNETIUM TC 99M SESTAMIBI GENERIC - CARDIOLITE
10.0000 | Freq: Once | INTRAVENOUS | Status: AC | PRN
  Administered 2012-07-30: 10 via INTRAVENOUS

## 2012-07-30 NOTE — Progress Notes (Signed)
[facility] SITE 3 NUCLEAR MED [address] [place], [place] [address] [phone]    Cardiology Nuclear Med Study  Kelsey Fuller is a 72 y.o. female     MRN : [id number]     DOB: 1940/05/09  Procedure Date: 07/30/2012  Nuclear Med Background Indication for Stress Test:  Evaluation for Ischemia and Abnormal EKG History:  Asthma, COPD and 2009 MPS: (-) ischemia NL EF: 71% 07/24/12 ECHO Cardiac Risk Factors: Family History - CAD and History of Smoking  Symptoms:  SOB   Nuclear Pre-Procedure Caffeine/Decaff Intake:  None > 12 hrs NPO After: 8:00pm   Lungs:  clear O2 Sat: 97% on room air. IV 0.9% NS with Angio Cath:  20g  IV Site: R Antecubital x 1, tolerated well IV Started by:  Irean Hong, RN  Chest Size (in):  34 Cup Size: C  Height: 5\' 3"  (1.6 m)  Weight:  116 lb (52.617 kg)  BMI:  Body mass index is 20.55 kg/(m^2). Tech Comments:  n/a    Nuclear Med Study 1 or 2 day study: 1 day  Stress Test Type:  Lexiscan  Reading MD: Dietrich Pates, MD  Order Authorizing Provider:  Kristeen Miss, MD  Resting Radionuclide: Technetium 54m Sestamibi  Resting Radionuclide Dose: 10.9 mCi   Stress Radionuclide:  Technetium 61m Sestamibi  Stress Radionuclide Dose: 33.0 mCi           Stress Protocol Rest HR: 64 Stress HR: 107  Rest BP: 159/79 Stress BP: 153/66  Exercise Time (min): n/a METS: n/a   Predicted Max HR: 147 bpm % Max HR: 72.79 bpm Rate Pressure Product: 41324   Dose of Adenosine (mg):  n/a Dose of Lexiscan: 0.4 mg  Dose of Atropine (mg): n/a Dose of Dobutamine: n/a mcg/kg/min (at max HR)  Stress Test Technologist: Milana Na, EMT-P  Nuclear Technologist:  Domenic Polite, CNMT     Rest Procedure:  Myocardial perfusion imaging was performed at rest 45 minutes following the intravenous administration of Technetium 42m Sestamibi. Rest ECG: NST.  T wave inversion V1 to V4.  Stress Procedure:  The patient received IV Lexiscan 0.4 mg over 15-seconds.   Technetium 64m Sestamibi injected at 30-seconds. This patient was sob, lt. Headed, legs were weak, and had a headache with the Lexiscan injection. Quantitative spect images were obtained after a 45 minute delay. Stress ECG: Rate induced LBBB.  Normalized in recovery.  Overall nondiagnostic.  QPS Raw Data Images: Soft tissue (diaphragm, breast) surround heart Stress Images:  Normal homogeneous uptake in all areas of the myocardium. Rest Images:  Normal homogeneous uptake in all areas of the myocardium. Subtraction (SDS):  No evidence of ischemia. Transient Ischemic Dilatation (Normal <1.22):  1.02 Lung/Heart Ratio (Normal <0.45):  0.25  Quantitative Gated Spect Images QGS EDV:  71 ml QGS ESV:  27 ml  Impression Exercise Capacity:  Lexiscan with no exercise. BP Response:  Normal blood pressure response. Clinical Symptoms:  No chest pain. ECG Impression:  Intermitt incomplete LBBB Comparison with Prior Nuclear Study:  No change from previous exam.  Overall Impression:  Normal stress nuclear study.  LV Ejection Fraction: 62%.  LV Wall Motion:  NL LV Function; NL Wall Motion  Dietrich Pates

## 2013-04-19 ENCOUNTER — Other Ambulatory Visit: Payer: Self-pay | Admitting: Physician Assistant

## 2013-04-23 ENCOUNTER — Other Ambulatory Visit: Payer: Self-pay | Admitting: Physician Assistant

## 2013-04-23 DIAGNOSIS — M8580 Other specified disorders of bone density and structure, unspecified site: Secondary | ICD-10-CM

## 2013-05-02 DIAGNOSIS — M545 Low back pain, unspecified: Secondary | ICD-10-CM | POA: Diagnosis not present

## 2013-05-02 DIAGNOSIS — IMO0002 Reserved for concepts with insufficient information to code with codable children: Secondary | ICD-10-CM | POA: Diagnosis not present

## 2013-05-07 DIAGNOSIS — M545 Low back pain, unspecified: Secondary | ICD-10-CM | POA: Diagnosis not present

## 2013-05-07 DIAGNOSIS — IMO0002 Reserved for concepts with insufficient information to code with codable children: Secondary | ICD-10-CM | POA: Diagnosis not present

## 2013-05-08 ENCOUNTER — Ambulatory Visit
Admission: RE | Admit: 2013-05-08 | Discharge: 2013-05-08 | Disposition: A | Discharge status: Home / Self Care | Payer: Medicare Other | Source: Ambulatory Visit | Attending: Physician Assistant | Admitting: Physician Assistant

## 2013-05-08 DIAGNOSIS — M949 Disorder of cartilage, unspecified: Secondary | ICD-10-CM | POA: Diagnosis not present

## 2013-05-08 DIAGNOSIS — M899 Disorder of bone, unspecified: Secondary | ICD-10-CM | POA: Diagnosis not present

## 2013-05-08 DIAGNOSIS — M8580 Other specified disorders of bone density and structure, unspecified site: Secondary | ICD-10-CM

## 2013-05-09 DIAGNOSIS — IMO0002 Reserved for concepts with insufficient information to code with codable children: Secondary | ICD-10-CM | POA: Diagnosis not present

## 2013-05-09 DIAGNOSIS — M545 Low back pain, unspecified: Secondary | ICD-10-CM | POA: Diagnosis not present

## 2013-05-14 DIAGNOSIS — M545 Low back pain, unspecified: Secondary | ICD-10-CM | POA: Diagnosis not present

## 2013-05-14 DIAGNOSIS — IMO0002 Reserved for concepts with insufficient information to code with codable children: Secondary | ICD-10-CM | POA: Diagnosis not present

## 2013-05-16 DIAGNOSIS — M545 Low back pain, unspecified: Secondary | ICD-10-CM | POA: Diagnosis not present

## 2013-05-16 DIAGNOSIS — IMO0002 Reserved for concepts with insufficient information to code with codable children: Secondary | ICD-10-CM | POA: Diagnosis not present

## 2013-05-20 DIAGNOSIS — N390 Urinary tract infection, site not specified: Secondary | ICD-10-CM | POA: Diagnosis not present

## 2013-05-21 DIAGNOSIS — IMO0002 Reserved for concepts with insufficient information to code with codable children: Secondary | ICD-10-CM | POA: Diagnosis not present

## 2013-05-21 DIAGNOSIS — M545 Low back pain, unspecified: Secondary | ICD-10-CM | POA: Diagnosis not present

## 2013-05-23 DIAGNOSIS — M545 Low back pain, unspecified: Secondary | ICD-10-CM | POA: Diagnosis not present

## 2013-05-23 DIAGNOSIS — IMO0002 Reserved for concepts with insufficient information to code with codable children: Secondary | ICD-10-CM | POA: Diagnosis not present

## 2013-05-27 DIAGNOSIS — M5380 Other specified dorsopathies, site unspecified: Secondary | ICD-10-CM | POA: Diagnosis not present

## 2013-05-27 DIAGNOSIS — M545 Low back pain, unspecified: Secondary | ICD-10-CM | POA: Diagnosis not present

## 2013-05-27 DIAGNOSIS — M48061 Spinal stenosis, lumbar region without neurogenic claudication: Secondary | ICD-10-CM | POA: Diagnosis not present

## 2013-06-08 DIAGNOSIS — IMO0002 Reserved for concepts with insufficient information to code with codable children: Secondary | ICD-10-CM | POA: Diagnosis not present

## 2013-06-08 DIAGNOSIS — M545 Low back pain, unspecified: Secondary | ICD-10-CM | POA: Diagnosis not present

## 2013-06-11 DIAGNOSIS — IMO0002 Reserved for concepts with insufficient information to code with codable children: Secondary | ICD-10-CM | POA: Diagnosis not present

## 2013-06-11 DIAGNOSIS — M545 Low back pain, unspecified: Secondary | ICD-10-CM | POA: Diagnosis not present

## 2013-06-13 DIAGNOSIS — M545 Low back pain, unspecified: Secondary | ICD-10-CM | POA: Diagnosis not present

## 2013-06-13 DIAGNOSIS — IMO0002 Reserved for concepts with insufficient information to code with codable children: Secondary | ICD-10-CM | POA: Diagnosis not present

## 2013-06-18 DIAGNOSIS — M545 Low back pain, unspecified: Secondary | ICD-10-CM | POA: Diagnosis not present

## 2013-06-18 DIAGNOSIS — IMO0002 Reserved for concepts with insufficient information to code with codable children: Secondary | ICD-10-CM | POA: Diagnosis not present

## 2013-06-20 DIAGNOSIS — IMO0002 Reserved for concepts with insufficient information to code with codable children: Secondary | ICD-10-CM | POA: Diagnosis not present

## 2013-06-20 DIAGNOSIS — M545 Low back pain, unspecified: Secondary | ICD-10-CM | POA: Diagnosis not present

## 2013-07-26 DIAGNOSIS — M5137 Other intervertebral disc degeneration, lumbosacral region: Secondary | ICD-10-CM | POA: Diagnosis not present

## 2013-07-26 DIAGNOSIS — M545 Low back pain, unspecified: Secondary | ICD-10-CM | POA: Diagnosis not present

## 2013-07-26 DIAGNOSIS — M48061 Spinal stenosis, lumbar region without neurogenic claudication: Secondary | ICD-10-CM | POA: Diagnosis not present

## 2013-07-26 DIAGNOSIS — IMO0002 Reserved for concepts with insufficient information to code with codable children: Secondary | ICD-10-CM | POA: Diagnosis not present

## 2013-08-07 ENCOUNTER — Encounter: Payer: Self-pay | Admitting: Cardiovascular Disease

## 2013-08-07 ENCOUNTER — Ambulatory Visit (INDEPENDENT_AMBULATORY_CARE_PROVIDER_SITE_OTHER): Payer: Medicare Other | Admitting: Cardiovascular Disease

## 2013-08-07 VITALS — BP 114/75 | HR 71 | Ht 63.0 in | Wt 116.4 lb

## 2013-08-07 DIAGNOSIS — R0609 Other forms of dyspnea: Secondary | ICD-10-CM | POA: Diagnosis not present

## 2013-08-07 DIAGNOSIS — R0989 Other specified symptoms and signs involving the circulatory and respiratory systems: Secondary | ICD-10-CM

## 2013-08-07 DIAGNOSIS — R0600 Dyspnea, unspecified: Secondary | ICD-10-CM

## 2013-08-07 NOTE — Patient Instructions (Signed)
Your physician recommends that you continue on your current medications as directed. Please refer to the Current Medication list given to you today.  Your physician recommends that you schedule a follow-up appointment in: as needed with Dr. Nahser  

## 2013-08-07 NOTE — Assessment & Plan Note (Signed)
Kelsey Fuller presents today for followup visit. Her Myoview study revealed normal left ventricular function with no evidence of ischemia. Her echocardiogram revealed mild diastolic dysfunction he just trivial valvular disease.  I suspect her shortness breath is mostly due to her COPD. The amount of diastolic dysfunction but she has most likely related to her age. Her blood pressure is normal. There is no real treatment that we have for this at this point.  I'll see her on an as-needed basis. She'll continue to followup with her general medical Dr.

## 2013-08-07 NOTE — Progress Notes (Signed)
Kelsey Fuller Date of Birth  1940/04/27       [facility]    [employer] [address] [place], [place], [place]  [address]   [place], [place]  [address] [phone]     [phone]   Fax  [phone]    Fax [phone]  Problem List: 1. Abnormal ECG 2. COPD 3. Family history of cardiomyopathy.   History of Present Illness:  Kelsey Fuller is a 73 yo who I have seen in the past.  She has chronic dyspnea that she thinks is due to COPD but has had a sister and her mother die of complications due to cardiomyopathy.    She denies any chest pain.  She is chronically short of breath.    I saw her in  2009 for evaluation of dyspnea.  She had a Lexiscan Myoview study at that time which revealed no ischemia. She had an echocardiogram which revealed normal left ventricular systolic function. She did have mild diastolic dysfunction.  August 07, 2013:  Kelsey Fuller is doing OK. No complaints.    She has had some atypical CP and dyspnea myoview in 2014 showed-- Normal stress nuclear study.  LV Ejection Fraction: 62%.     Current Outpatient Prescriptions on File Prior to Visit  Medication Sig Dispense Refill  . albuterol (PROVENTIL) (2.5 MG/3ML) 0.083% nebulizer solution Take 2.5 mg by nebulization every 6 (six) hours as needed.        Marland Kitchen aspirin 81 MG tablet Take 81 mg by mouth daily.      . B Complex-C (SUPER B COMPLEX PO) Take by mouth daily.      Marland Kitchen FLAXSEED, LINSEED, PO Take by mouth daily.       . Multiple Vitamin (MULTIVITAMIN) capsule Take 1 capsule by mouth daily.        . NON FORMULARY Lanaprost Eye Drop 1 Daily      . tiotropium (SPIRIVA) 18 MCG inhalation capsule Place 18 mcg into inhaler and inhale daily.         No current facility-administered medications on file prior to visit.    Allergies  Allergen Reactions  . Codeine   . Penicillins     Past Medical History  Diagnosis Date  . Chronic headaches   . Asthma   . Arthritis    . Shortness of breath on exertion   . Leg pain   . COPD (chronic obstructive pulmonary disease)   . Osteoporosis   . Glaucoma   . Thrombophlebitis     Past Surgical History  Procedure Laterality Date  . Breast surgery      History  Smoking status  . Former Smoker -- 0.50 packs/day for 20 years  . Types: Cigarettes  . Quit date: 04/12/1975  Smokeless tobacco  . Never Used    History  Alcohol Use  . 0.5 oz/week  . 1 drink(s) per week    Family History  Problem Relation Age of Onset  . Diabetes Mother   . Heart disease Mother   . Hypertension Mother   . Heart disease Father     Reviw of Systems:  Reviewed in the HPI.  All other systems are negative.  Physical Exam: Blood pressure 114/75, pulse 71, height 5\' 3"  (1.6 m), weight 116 lb 6.4 oz (52.799 kg). General: Well developed, well nourished, in no acute distress.  Head: Normocephalic, atraumatic, sclera non-icteric, mucus membranes are moist,   Neck: Supple. Carotids are  2 + without bruits. No JVD   Lungs: Clear   Heart: RR, normal S1, S2, soft systolic murmur  Abdomen: Soft, non-tender, non-distended with normal bowel sounds.  Msk:  Strength and tone are normal   Extremities: No clubbing or cyanosis. No edema.  Distal pedal pulses are 2+ and equal    Neuro: CN II - XII intact.  Alert and oriented X 3.   Psych:  Normal   ECG:   August 07, 2013: Normal sinus rhythm at 71. She has no ST or T wave changes. EKG is normal. Assessment / Plan:

## 2013-08-08 DIAGNOSIS — H2510 Age-related nuclear cataract, unspecified eye: Secondary | ICD-10-CM | POA: Diagnosis not present

## 2013-08-08 DIAGNOSIS — H4011X0 Primary open-angle glaucoma, stage unspecified: Secondary | ICD-10-CM | POA: Diagnosis not present

## 2013-08-16 ENCOUNTER — Telehealth: Payer: Self-pay | Admitting: Family Medicine

## 2013-08-16 NOTE — Telephone Encounter (Signed)
tsd  °

## 2013-08-20 ENCOUNTER — Telehealth: Payer: Self-pay | Admitting: Family Medicine

## 2013-08-20 NOTE — Telephone Encounter (Signed)
Pt's husband stopped by and stated records are in epic

## 2013-09-06 ENCOUNTER — Encounter: Payer: Self-pay | Admitting: Family Medicine

## 2013-09-06 ENCOUNTER — Ambulatory Visit (INDEPENDENT_AMBULATORY_CARE_PROVIDER_SITE_OTHER): Payer: Medicare Other | Admitting: Family Medicine

## 2013-09-06 VITALS — BP 150/70 | HR 80 | Wt 115.0 lb

## 2013-09-06 DIAGNOSIS — Z8249 Family history of ischemic heart disease and other diseases of the circulatory system: Secondary | ICD-10-CM | POA: Diagnosis not present

## 2013-09-06 DIAGNOSIS — M48061 Spinal stenosis, lumbar region without neurogenic claudication: Secondary | ICD-10-CM | POA: Diagnosis not present

## 2013-09-06 DIAGNOSIS — J449 Chronic obstructive pulmonary disease, unspecified: Secondary | ICD-10-CM

## 2013-09-06 NOTE — Progress Notes (Signed)
   Subjective:    Patient ID: Kelsey Fuller, female    DOB: 12/31/1940, 73 y.o.   MRN: [id number]  HPI He is here to get reestablished. She was a patient here several years ago and would like to return. She presently is on Spiriva for underlying lung disease. She has been on this for several years. She quit smoking in the 1950s. She recently had a cardiology evaluation because of family history of cardiomyopathy. Apparently was negative. She has been suffering recently from back pain and apparently did have a recent injection which apparently has been quite successful. She has had MRI done for evaluation of this. She has no other concerns or complaints.   Review of Systems     Objective:   Physical Exam Alert and in no distress otherwise not examined       Assessment & Plan:  COPD (chronic obstructive pulmonary disease)  Spinal stenosis of lumbar region  Family history of cardiomyopathy  I will get a release of information form to get her previous medical records. Continue on present medications. If she needs further help with her back, I will make the appropriate referral.

## 2013-10-07 ENCOUNTER — Encounter: Payer: Self-pay | Admitting: Family Medicine

## 2013-10-07 ENCOUNTER — Ambulatory Visit (INDEPENDENT_AMBULATORY_CARE_PROVIDER_SITE_OTHER): Payer: Medicare Other | Admitting: Family Medicine

## 2013-10-07 VITALS — BP 124/68 | HR 80 | Temp 98.0°F | Ht 63.0 in | Wt 112.0 lb

## 2013-10-07 DIAGNOSIS — R300 Dysuria: Secondary | ICD-10-CM | POA: Diagnosis not present

## 2013-10-07 DIAGNOSIS — N390 Urinary tract infection, site not specified: Secondary | ICD-10-CM | POA: Diagnosis not present

## 2013-10-07 LAB — POCT URINALYSIS DIPSTICK
Bilirubin, UA: NEGATIVE
GLUCOSE UA: NEGATIVE
Ketones, UA: NEGATIVE
NITRITE UA: NEGATIVE
PH UA: 5
PROTEIN UA: NEGATIVE
RBC UA: NEGATIVE
Spec Grav, UA: 1.015
UROBILINOGEN UA: NEGATIVE

## 2013-10-07 MED ORDER — CIPROFLOXACIN HCL 250 MG PO TABS: 250.0000 mg | ORAL_TABLET | Freq: Two times a day (BID) | ORAL | Status: DC

## 2013-10-07 NOTE — Progress Notes (Signed)
Chief Complaint  Patient presents with  . Urinary Tract Infection    pain and pressure with urination that started this past Friday, as well as some abdominal pain. Did take Uristsat-I can see 2+ in UA even with the Uristat.    Today is the fourth day of lower abdominal discomfort.  She describes pressure, worse with standing.  +dysuria, urgency and frequency.  Last UTI was a few months ago (at [facility]).  She has been taking urestat, which helps with her discomfort.  Feels similar to prior UTI's.  Denies nausea or vomiting, fevers or chills.  She has some pain across the low back.  Denies any flank pain.  Past Medical History  Diagnosis Date  . Chronic headaches   . Asthma   . Arthritis   . Shortness of breath on exertion   . Leg pain   . COPD (chronic obstructive pulmonary disease)   . Osteoporosis   . Glaucoma   . Thrombophlebitis    Past Surgical History  Procedure Laterality Date  . Breast surgery     History   Social History  . Marital Status: Married    Spouse Name: N/A    Number of Children: N/A  . Years of Education: N/A   Occupational History  . Not on file.   Social History Main Topics  . Smoking status: Former Smoker -- 0.50 packs/day for 20 years    Types: Cigarettes    Quit date: 04/12/1975  . Smokeless tobacco: Never Used  . Alcohol Use: 0.5 oz/week    1 drink(s) per week  . Drug Use: No  . Sexual Activity: Not on file   Other Topics Concern  . Not on file   Social History Narrative  . No narrative on file    Outpatient Encounter Prescriptions as of 10/07/2013  Medication Sig  . aspirin 81 MG tablet Take 81 mg by mouth daily.  . B Complex-C (SUPER B COMPLEX PO) Take by mouth daily.  Marland Kitchen FLAXSEED, LINSEED, PO Take by mouth daily.   . Multiple Vitamin (MULTIVITAMIN) capsule Take 1 capsule by mouth daily.    . NON FORMULARY Lanaprost Eye Drop 1 Daily  . tiotropium (SPIRIVA) 18 MCG inhalation capsule Place 18 mcg into inhaler and inhale daily.    Marland Kitchen  acetaminophen (TYLENOL) 325 MG tablet Take 650 mg by mouth every 6 (six) hours as needed.  Marland Kitchen albuterol (PROVENTIL) (2.5 MG/3ML) 0.083% nebulizer solution Take 2.5 mg by nebulization every 6 (six) hours as needed.    . cyclobenzaprine (FLEXERIL) 5 MG tablet Take 5 mg by mouth 3 (three) times daily as needed for muscle spasms.  Marland Kitchen ibuprofen (ADVIL,MOTRIN) 200 MG tablet Take 200 mg by mouth every 6 (six) hours as needed.    Allergies  Allergen Reactions  . Codeine   . Penicillins    ROS:  No fever, chills, nausea, vomiting, flank pain. No vaginal discharge, bleeding.  Breathing is at baseline--some shortness of breath going up stairs and when leaning forward. No URI symptoms, chest pain, bleeding/bruising/rash or other complaints except as per HPI  PHYSICAL EXAM: Blood pressure 124/68, pulse 80, temperature 98 F (36.7 C), temperature source Oral, height 5\' 3"  (1.6 m), weight 112 lb (50.803 kg). Well developed, pleasant elderly female in no distress (accompanied by her granddaughter). Neck: no lymphadenopathy or mass Heart: regular rate and rhythm Lungs: clear bilaterally Abdomen: Tender suprapubically. No rebound or guarding, no mass or hepatosplenomegaly Back: no CVA tenderness Extremities: no edema Psych: normal  mood, affect, hygiene and grooming Neuro: alert and oriented.  Normal strength, gait  Urine dip: 2+ leuks (specimen was lightly orange from urestat)  ASSESSMENT/PLAN:  Burning with urination - Plan: POCT Urinalysis Dipstick  Urinary tract infection, site not specified - Plan: ciprofloxacin (CIPRO) 250 MG tablet, Urine culture  Reviewed risks/side effects, signs of worsening condition/pyelo, and to f/u if symptoms progress. Briefly discussed cranberry (juice, extract) as potential early treatment/prevention of E.coli infections if used at first onset of any symptoms.  Discussed reasons for only short-term use of pyridium.  F/u if symptoms persist/worsen.  Will contact with  culture results when available

## 2013-10-07 NOTE — Patient Instructions (Signed)

## 2013-10-09 LAB — URINE CULTURE

## 2013-11-14 DIAGNOSIS — H4011X0 Primary open-angle glaucoma, stage unspecified: Secondary | ICD-10-CM | POA: Diagnosis not present

## 2013-11-14 DIAGNOSIS — H2510 Age-related nuclear cataract, unspecified eye: Secondary | ICD-10-CM | POA: Diagnosis not present

## 2014-02-12 DIAGNOSIS — H25042 Posterior subcapsular polar age-related cataract, left eye: Secondary | ICD-10-CM | POA: Diagnosis not present

## 2014-02-12 DIAGNOSIS — H4011X1 Primary open-angle glaucoma, mild stage: Secondary | ICD-10-CM | POA: Diagnosis not present

## 2014-02-21 DIAGNOSIS — H349 Unspecified retinal vascular occlusion: Secondary | ICD-10-CM | POA: Diagnosis not present

## 2014-02-21 DIAGNOSIS — H25013 Cortical age-related cataract, bilateral: Secondary | ICD-10-CM | POA: Diagnosis not present

## 2014-02-21 DIAGNOSIS — H2513 Age-related nuclear cataract, bilateral: Secondary | ICD-10-CM | POA: Diagnosis not present

## 2014-03-11 DIAGNOSIS — H25012 Cortical age-related cataract, left eye: Secondary | ICD-10-CM | POA: Diagnosis not present

## 2014-03-11 DIAGNOSIS — H25812 Combined forms of age-related cataract, left eye: Secondary | ICD-10-CM | POA: Diagnosis not present

## 2014-03-11 DIAGNOSIS — H268 Other specified cataract: Secondary | ICD-10-CM | POA: Diagnosis not present

## 2014-03-11 DIAGNOSIS — H2512 Age-related nuclear cataract, left eye: Secondary | ICD-10-CM | POA: Diagnosis not present

## 2014-07-16 DIAGNOSIS — H4011X1 Primary open-angle glaucoma, mild stage: Secondary | ICD-10-CM | POA: Diagnosis not present

## 2014-08-06 ENCOUNTER — Ambulatory Visit (INDEPENDENT_AMBULATORY_CARE_PROVIDER_SITE_OTHER): Payer: Medicare Other | Admitting: Family Medicine

## 2014-08-06 ENCOUNTER — Encounter: Payer: Self-pay | Admitting: Family Medicine

## 2014-08-06 VITALS — BP 138/70 | HR 92 | Temp 98.2°F | Ht 63.0 in | Wt 109.0 lb

## 2014-08-06 DIAGNOSIS — J441 Chronic obstructive pulmonary disease with (acute) exacerbation: Secondary | ICD-10-CM | POA: Diagnosis not present

## 2014-08-06 MED ORDER — ALBUTEROL SULFATE (2.5 MG/3ML) 0.083% IN NEBU
2.5000 mg | INHALATION_SOLUTION | Freq: Once | RESPIRATORY_TRACT | Status: AC
  Administered 2014-08-06: 2.5 mg via RESPIRATORY_TRACT

## 2014-08-06 MED ORDER — ALBUTEROL SULFATE HFA 108 (90 BASE) MCG/ACT IN AERS: 2.0000 | INHALATION_SPRAY | Freq: Four times a day (QID) | RESPIRATORY_TRACT | Status: DC | PRN

## 2014-08-06 MED ORDER — TIOTROPIUM BROMIDE MONOHYDRATE 18 MCG IN CAPS: 18.0000 ug | ORAL_CAPSULE | Freq: Every day | RESPIRATORY_TRACT | Status: DC

## 2014-08-06 NOTE — Progress Notes (Signed)
Chief Complaint  Patient presents with  . Cough    hsa COPD and has been coughing a lot lately and it has been harder to breathe-wonders if it could be allergy related due to pollen-she has not been taking Spiriva regularly.    She has been coughing more over the last 3-4 weeks than her usual cough from COPD.  Cough is primarily dry, often hacky.  She denies sniffling, sneezing, itchy or watery eyes.  Occasionally has some postnasal drainage.  When she coughs up phlegm (not often) it is clear, not discolored.  Denies fevers, chills.  She has not been using the Spiriva regularly, just as needed.  She has been using it recently, since the cough got worse, and it has helped somewhat.  She reports that her Spiriva is old/expired, and she will run out tomorrow.  She has albuterol, and needed to use it when walking a lot (ie when at the [place] recently, needed to use it twice; not really needing other times).   She hasn't had PFT's since she was diagnosed with COPD (well over 5 years ago)  She lost her husband in February. She hasn't had routine care here.  Previously used to see Dr. Pleas Koch and also Dr. Koleen Nimrod.  Immunizations-- None in the system. She reports having had pneumovax in the past and zostavax. Unsure of last tetanus.    Past Medical History  Diagnosis Date  . Chronic headaches   . Asthma     (pt denies)  . Arthritis   . Shortness of breath on exertion   . Leg pain   . COPD (chronic obstructive pulmonary disease)   . Osteoporosis   . Glaucoma   . Thrombophlebitis    Past Surgical History  Procedure Laterality Date  . Breast surgery    . Varicose vein surgery    . Appendectomy    . Tonsillectomy     History   Social History  . Marital Status: Married    Spouse Name: N/A  . Number of Children: N/A  . Years of Education: N/A   Occupational History  . Not on file.   Social History Main Topics  . Smoking status: Former Smoker -- 0.50 packs/day for 20 years    Types: Cigarettes    Quit date: 04/12/1975  . Smokeless tobacco: Never Used  . Alcohol Use: 0.6 oz/week    1 Standard drinks or equivalent per week     Comment: occasional glass of wine, usually on Sundays when cooking  . Drug Use: No  . Sexual Activity: Not on file   Other Topics Concern  . Not on file   Social History Narrative   Lives alone.  Widowed in 07/2014.  3 sons, 3 grandchildren, all locally. Originally from [place], [place] [facility]   Outpatient Encounter Prescriptions as of 08/06/2014  Medication Sig Note  . aspirin 81 MG tablet Take 81 mg by mouth daily.   Marland Kitchen latanoprost (XALATAN) 0.005 % ophthalmic solution Place 1 drop into both eyes at bedtime.  08/06/2014: Received from: External Pharmacy  . acetaminophen (TYLENOL) 325 MG tablet Take 650 mg by mouth every 6 (six) hours as needed.   Marland Kitchen ibuprofen (ADVIL,MOTRIN) 200 MG tablet Take 200 mg by mouth every 6 (six) hours as needed.   . Multiple Vitamin (MULTIVITAMIN) capsule Take 1 capsule by mouth daily.     Marland Kitchen tiotropium (SPIRIVA) 18 MCG inhalation capsule Place 18 mcg into inhaler and inhale daily.   08/06/2014: She has only  been taking this sporadically, just prn.  Needing now--but current rx is expired, and will run out tomorrow  . [DISCONTINUED] albuterol (PROVENTIL) (2.5 MG/3ML) 0.083% nebulizer solution Take 2.5 mg by nebulization every 6 (six) hours as needed.     . [DISCONTINUED] B Complex-C (SUPER B COMPLEX PO) Take by mouth daily.   . [DISCONTINUED] ciprofloxacin (CIPRO) 250 MG tablet Take 1 tablet (250 mg total) by mouth 2 (two) times daily.   . [DISCONTINUED] cyclobenzaprine (FLEXERIL) 5 MG tablet Take 5 mg by mouth 3 (three) times daily as needed for muscle spasms.   . [DISCONTINUED] FLAXSEED, LINSEED, PO Take by mouth daily.    . [DISCONTINUED] NON FORMULARY Lanaprost Eye Drop 1 Daily    Allergies  Allergen Reactions  . Codeine   . Penicillins   . Sulfa Antibiotics     Told as a child by her mother never to  take  . Fish Allergy Diarrhea    Scaled fish   ROS:  No fevers, chills, headaches, dizziness, chest pain, nausea, vomiting, bowel changes, joint pains, bleeding, bruising, rashes, shortness of breath.  Denies depression, doing okay since husband's loss. No sore throat, ear pain, sinus pain.  PHYSICAL EXAM: BP 138/70 mmHg  Pulse 92  Temp(Src) 98.2 F (36.8 C) (Tympanic)  Ht 5\' 3"  (1.6 m)  Wt 109 lb (49.442 kg)  BMI 19.31 kg/m2  Well developed, pleasant female, with frequent dry cough and occasional throat-clearing. Speaking easily in full sentences, in no distress. HEENT: PERRL, EOMi, conjunctiva clear.  TM's and EAC's normal. Nasal mucosa is mildly edematous, pale, no purulence or mucus noted. Sinuses nontender. OP is clear.  Upper and lower dentures Neck: no lymphadenopathy, thyromegaly or mass Heart: regular rate and rhythm without murmur Lungs: clear bilaterally.  Some coughing during deep breathing.  No wheeze, rales, ronchi noted, slightly distant breath sounds Abdomen: soft, nontender, no mass Extremities: no edema, 2+ pulse Neuro: alert and oriented.  Cranial nerves intact. Normal gait, strength Psych: full range of affect.  Became slightly teary in discussing her husband, who passed away in 17-Jun-2022.  PFT's pre and post nebulizer treatment today.--patient felt better after nebulizer treatment.  PFT's showed mild airway obstruction without significant improvement after bronchodilator treatment.  ASSESSMENT/PLAN:  COPD with exacerbation - Plan: albuterol (PROVENTIL HFA;VENTOLIN HFA) 108 (90 BASE) MCG/ACT inhaler, tiotropium (SPIRIVA) 18 MCG inhalation capsule, Spirometry: Pre & Post Eval, Obtain respiratory scores PRE and POST Administration of Bronchodilator, albuterol (PROVENTIL) (2.5 MG/3ML) 0.083% nebulizer solution 2.5 mg  Refilled Spiriva. To use daily, not prn.  Use albuterol prn May have concomitant allergies.  Use antihistamines and mucinex prn.  Briefly discussed  that what health maintenance items were needed/recommended.  We need to try and get her immunizations. She will need Prevnar. She may need Tetanus (and won't be covered by Medicare). Discussed that she will need DEXA  She will schedule AWV/med check+

## 2014-08-06 NOTE — Patient Instructions (Signed)
Use the Spiriva every day. Use the albuterol as needed. You may also have a component of allergies with postnasal drainage.  Taking a claritin once daily, as well as Mucinex as needed (to keep mucus thin) might also help with your cough.

## 2014-08-28 ENCOUNTER — Telehealth: Payer: Self-pay | Admitting: Internal Medicine

## 2014-08-28 DIAGNOSIS — J441 Chronic obstructive pulmonary disease with (acute) exacerbation: Secondary | ICD-10-CM

## 2014-08-28 MED ORDER — TIOTROPIUM BROMIDE MONOHYDRATE 18 MCG IN CAPS: 18.0000 ug | ORAL_CAPSULE | Freq: Every day | RESPIRATORY_TRACT | Status: DC

## 2014-08-28 NOTE — Telephone Encounter (Signed)
Fax from [facility] for a 90 day supply for spiriva handihaler 46mcg

## 2014-08-28 NOTE — Telephone Encounter (Signed)
Done

## 2014-09-22 DIAGNOSIS — Z0279 Encounter for issue of other medical certificate: Secondary | ICD-10-CM

## 2014-09-23 ENCOUNTER — Telehealth: Payer: Self-pay | Admitting: Internal Medicine

## 2014-09-23 NOTE — Telephone Encounter (Signed)
Faxed over medical records to [facility] @ [phone]  On June 13th

## 2014-10-15 DIAGNOSIS — H4011X1 Primary open-angle glaucoma, mild stage: Secondary | ICD-10-CM | POA: Diagnosis not present

## 2014-10-15 DIAGNOSIS — H26492 Other secondary cataract, left eye: Secondary | ICD-10-CM | POA: Diagnosis not present

## 2014-10-29 ENCOUNTER — Encounter: Payer: Self-pay | Admitting: Family Medicine

## 2014-10-29 ENCOUNTER — Other Ambulatory Visit (HOSPITAL_COMMUNITY)
Admission: RE | Admit: 2014-10-29 | Discharge: 2014-10-29 | Disposition: A | Discharge status: Home / Self Care | Payer: Medicare Other | Source: Ambulatory Visit | Attending: Family Medicine | Admitting: Family Medicine

## 2014-10-29 ENCOUNTER — Ambulatory Visit (INDEPENDENT_AMBULATORY_CARE_PROVIDER_SITE_OTHER): Payer: Medicare Other | Admitting: Family Medicine

## 2014-10-29 VITALS — BP 130/72 | HR 80 | Ht 63.25 in | Wt 104.8 lb

## 2014-10-29 DIAGNOSIS — J449 Chronic obstructive pulmonary disease, unspecified: Secondary | ICD-10-CM

## 2014-10-29 DIAGNOSIS — Z131 Encounter for screening for diabetes mellitus: Secondary | ICD-10-CM

## 2014-10-29 DIAGNOSIS — Z1151 Encounter for screening for human papillomavirus (HPV): Secondary | ICD-10-CM | POA: Insufficient documentation

## 2014-10-29 DIAGNOSIS — Z01419 Encounter for gynecological examination (general) (routine) without abnormal findings: Secondary | ICD-10-CM

## 2014-10-29 DIAGNOSIS — Z124 Encounter for screening for malignant neoplasm of cervix: Secondary | ICD-10-CM | POA: Insufficient documentation

## 2014-10-29 DIAGNOSIS — Z1211 Encounter for screening for malignant neoplasm of colon: Secondary | ICD-10-CM | POA: Diagnosis not present

## 2014-10-29 DIAGNOSIS — Z13220 Encounter for screening for lipoid disorders: Secondary | ICD-10-CM

## 2014-10-29 DIAGNOSIS — Z0000 Encounter for general adult medical examination without abnormal findings: Secondary | ICD-10-CM

## 2014-10-29 DIAGNOSIS — Z8249 Family history of ischemic heart disease and other diseases of the circulatory system: Secondary | ICD-10-CM

## 2014-10-29 DIAGNOSIS — M8580 Other specified disorders of bone density and structure, unspecified site: Secondary | ICD-10-CM

## 2014-10-29 DIAGNOSIS — R5383 Other fatigue: Secondary | ICD-10-CM

## 2014-10-29 DIAGNOSIS — E559 Vitamin D deficiency, unspecified: Secondary | ICD-10-CM

## 2014-10-29 LAB — CBC WITH DIFFERENTIAL/PLATELET
Basophils Absolute: 0 10*3/uL (ref 0.0–0.1)
Basophils Relative: 0 % (ref 0–1)
Eosinophils Absolute: 0.4 10*3/uL (ref 0.0–0.7)
Eosinophils Relative: 6 % — ABNORMAL HIGH (ref 0–5)
HCT: 43.9 % (ref 36.0–46.0)
Hemoglobin: 14.4 g/dL (ref 12.0–15.0)
Lymphocytes Relative: 48 % — ABNORMAL HIGH (ref 12–46)
Lymphs Abs: 3.3 10*3/uL (ref 0.7–4.0)
MCH: 28.3 pg (ref 26.0–34.0)
MCHC: 32.8 g/dL (ref 30.0–36.0)
MCV: 86.2 fL (ref 78.0–100.0)
MONO ABS: 0.7 10*3/uL (ref 0.1–1.0)
MPV: 10.5 fL (ref 8.6–12.4)
Monocytes Relative: 11 % (ref 3–12)
NEUTROS PCT: 35 % — AB (ref 43–77)
Neutro Abs: 2.4 10*3/uL (ref 1.7–7.7)
PLATELETS: 237 10*3/uL (ref 150–400)
RBC: 5.09 MIL/uL (ref 3.87–5.11)
RDW: 14.1 % (ref 11.5–15.5)
WBC: 6.8 10*3/uL (ref 4.0–10.5)

## 2014-10-29 LAB — LIPID PANEL
CHOLESTEROL: 173 mg/dL (ref 0–200)
HDL: 53 mg/dL (ref >=46)
LDL Cholesterol: 104 mg/dL — ABNORMAL HIGH (ref 0–99)
TRIGLYCERIDES: 79 mg/dL (ref <150)
Total CHOL/HDL Ratio: 3.3 Ratio
VLDL: 16 mg/dL (ref 0–40)

## 2014-10-29 LAB — COMPREHENSIVE METABOLIC PANEL
ALT: 10 U/L (ref 0–35)
AST: 14 U/L (ref 0–37)
Albumin: 3.8 g/dL (ref 3.5–5.2)
Alkaline Phosphatase: 80 U/L (ref 39–117)
BUN: 17 mg/dL (ref 6–23)
CHLORIDE: 104 meq/L (ref 96–112)
CO2: 26 mEq/L (ref 19–32)
Calcium: 9.1 mg/dL (ref 8.4–10.5)
Creat: 0.68 mg/dL (ref 0.50–1.10)
Glucose, Bld: 77 mg/dL (ref 70–99)
Potassium: 4.1 mEq/L (ref 3.5–5.3)
SODIUM: 140 meq/L (ref 135–145)
Total Bilirubin: 0.7 mg/dL (ref 0.2–1.2)
Total Protein: 7.2 g/dL (ref 6.0–8.3)

## 2014-10-29 LAB — HEMOCCULT GUIAC POC 1CARD (OFFICE): Fecal Occult Blood, POC: NEGATIVE

## 2014-10-29 LAB — TSH: TSH: 1.621 u[IU]/mL (ref 0.350–4.500)

## 2014-10-29 NOTE — Patient Instructions (Signed)
HEALTH MAINTENANCE RECOMMENDATIONS:  It is recommended that you get at least 30 minutes of aerobic exercise at least 5 days/week (for weight loss, you may need as much as 60-90 minutes). This can be any activity that gets your heart rate up. This can be divided in 10-15 minute intervals if needed, but try and build up your endurance at least once a week.  Weight bearing exercise is also recommended twice weekly.  Eat a healthy diet with lots of vegetables, fruits and fiber.  "Colorful" foods have a lot of vitamins (ie green vegetables, tomatoes, red peppers, etc).  Limit sweet tea, regular sodas and alcoholic beverages, all of which has a lot of calories and sugar.  Up to 1 alcoholic drink daily may be beneficial for women (unless trying to lose weight, watch sugars).  Drink a lot of water.  Calcium recommendations are 1200-1500 mg daily (1500 mg for postmenopausal women or women without ovaries), and vitamin D 1000 IU daily.  This should be obtained from diet and/or supplements (vitamins), and calcium should not be taken all at once, but in divided doses.  Monthly self breast exams and yearly mammograms for women over the age of 93 is recommended.  Sunscreen of at least SPF 30 should be used on all sun-exposed parts of the skin when outside between the hours of 10 am and 4 pm (not just when at beach or pool, but even with exercise, golf, tennis, and yard work!)  Use a sunscreen that says "broad spectrum" so it covers both UVA and UVB rays, and make sure to reapply every 1-2 hours.  Remember to change the batteries in your smoke detectors when changing your clock times in the spring and fall.  Use your seat belt every time you are in a car, and please drive safely and not be distracted with cell phones and texting while driving.   You are in need of a TdaP vaccine.  Medicare doesn't pay for this, but your secondary insurance or pharmacy benefit may cover it (for you to get at the pharmacy).  If it  is not covered, we are happy to give it here, but you will be charged for it--check with your insurance to see if you can get at pharmacy vs here.  You are in need of a pneumonia shot.  I don't know which one, because I'm not sure which you already had.  You need one pneumovax and one Prevnar-13.  Please check with the pharmacy that may have given it to you, and please get [place] the date and type of vaccine you had so that we can give you the other one.  These vaccines ARE covered, and we can give them to you in the office (or you can get at the pharmacy, just make sure that we get the date/type so we can put it in your chart).    We need to get your other immunization records as well. Please look into getting these from prior doctors.  Please call Dr. Lorie Fuller office and see when your last colonoscopy was, and when you are due (and see if they can send [place] a copy of your last one, since we don't have any of your records).  [facility] and schedule a routine mammogram (and give them [name] name).    Kelsey Fuller , Thank you for taking time to come for your Medicare Wellness Visit. I appreciate your ongoing commitment to your health goals. Please review the following plan  we discussed and let me know if I can assist you in the future.   These are the goals we discussed: Goals    None      This is a list of the screening recommended for you and due dates:  Health Maintenance  Topic Date Due  . Tetanus Vaccine  [birth year]/02/1958  . Colon Cancer Screening  05/02/1989  . Shingles Vaccine  05/03/1999  . Pneumonia vaccines (1 of 2 - PCV13) 05/02/2004  . Flu Shot  11/10/2014  . DEXA scan (bone density measurement)  Completed   You DO need a Tdap Colon cancer screening--I have no idea when it is due next (likely past due)--please call Kelsey Fuller have had the shingles vaccine--you don't need another, I just need to know the date to enter into the computer so it recognizes it. Yearly flu shots  are recommended--return here in the Fall (september/october) for the high dose flu shot. You will need another bone density test in 04/2015, as your last one was on the verge of osteoporosis.

## 2014-10-29 NOTE — Progress Notes (Signed)
Chief Complaint  Patient presents with  . Med check plus/AWV    fasting (light breakfast 10:00am) med check plus/AWV with pap. Had injury to left ankle 2 weeks ago(hit on kneeler at church) and still bothering her, bruising has subsided. Has had a left sided flank/back pain for a while. Also has been having ankle cramps x 6 months when she laying in bed.    Kelsey Fuller is a 74 y.o. female who presents for annual wellness visit and follow-up on chronic medical conditions.  She has the following concerns:  She hit her left lateral ankle--still a little swollen, and slightly tender to touch but is improving.  Denies any twisting injury. No pain with walking.  Before she gets out of bed, she notices some cramping in her ankles.  Better when she gets up and moves around.  Only in the morning, doesn't keep her up at night.    Left sided flank pain for about 6 months.  Hurts if she is on her feet for too long.  She admits to not drinking a lot of water.  Feels better if she sits down and rests.  Usually feels better if she drinks more water.  Doesn't hurt to take a deep breath.  COPD:  Breathing is good.  Taking trash cans up the hill causes some shortness of breath, relieved by 5 minutes of rest.  She only needs the albuterol very sporadically--needed it once when [place] the [place], but otherwise never.  She lost 4 pounds since her last visit.  Her appetite is up and down.   There is no immunization history on file for this patient.  She recalls getting one pneumonia vaccine through a pharmacy, doesn't recall which type of vaccine. She believes her tetanus shot was around 2000. She knows that she has had a shingles vaccine Last Pap smear: 10 years ago Last mammogram: 2012 Last colonoscopy: with Dr. Collene Mares. She cannot recall the date. Last DEXA: 04/2013 T-2.4 at left femur neck Dentist: has upper and lower dentures; hasn't been to the dentist in many years Ophtho: every 3 months (for  glaucoma), went recently Exercise: yardwork  She used to have more ice cream in her diet, still has 8 oz of whole milk daily.  No longer takes a calcium supplement.   Other doctors caring for patient include: Ophtho:  Dr. Delman Cheadle GI: Dr. Collene Mares Cardiologist:  Dr. Acie Fredrickson (sees yearly since her sister passed away). Dermatologist (hasn't seen in many years): Dr. Denna Haggard.   Depression, Fall and Functional Screens all in epic and entirely negative.  End of Life Discussion:  Patient does not have a living will and medical power of attorney  Past Medical History  Diagnosis Date  . Chronic headaches   . Asthma     (pt denies)  . Arthritis   . Shortness of breath on exertion   . Leg pain   . COPD (chronic obstructive pulmonary disease)   . Osteoporosis   . Glaucoma   . Thrombophlebitis     Past Surgical History  Procedure Laterality Date  . Breast surgery    . Varicose vein surgery    . Appendectomy    . Tonsillectomy      History   Social History  . Marital Status: Married    Spouse Name: N/A  . Number of Children: N/A  . Years of Education: N/A   Occupational History  . Not on file.   Social History Main Topics  .  Smoking status: Former Smoker -- 0.50 packs/day for 20 years    Types: Cigarettes    Quit date: 04/12/1975  . Smokeless tobacco: Never Used  . Alcohol Use: 0.6 oz/week    1 Standard drinks or equivalent per week     Comment: occasional glass of wine, usually on Sundays when cooking  . Drug Use: No  . Sexual Activity: Not Currently   Other Topics Concern  . Not on file   Social History Narrative   Lives alone.  Widowed in 07/2014.  3 sons, 3 grandchildren, all locally. Originally from [place], [place] ([place]). Retired Psychiatric nurse. Volunteers at [employer] History  Problem Relation Age of Onset  . Diabetes Mother   . Heart disease Mother     congestive heart failure  . Hypertension Mother   . Dementia Mother   . Heart disease Father      rheumatic heart disease  . Cancer Neg Hx     Outpatient Encounter Prescriptions as of 10/29/2014  Medication Sig Note  . aspirin 81 MG tablet Take 81 mg by mouth daily.   Marland Kitchen latanoprost (XALATAN) 0.005 % ophthalmic solution Place 1 drop into both eyes at bedtime.  08/06/2014: Received from: External Pharmacy  . Multiple Vitamin (MULTIVITAMIN) capsule Take 1 capsule by mouth daily.     Marland Kitchen tiotropium (SPIRIVA) 18 MCG inhalation capsule Place 1 capsule (18 mcg total) into inhaler and inhale daily.   Marland Kitchen acetaminophen (TYLENOL) 325 MG tablet Take 650 mg by mouth every 6 (six) hours as needed.   Marland Kitchen albuterol (PROVENTIL HFA;VENTOLIN HFA) 108 (90 BASE) MCG/ACT inhaler Inhale 2 puffs into the lungs every 6 (six) hours as needed for wheezing or shortness of breath. (Patient not taking: Reported on 10/29/2014)   . ibuprofen (ADVIL,MOTRIN) 200 MG tablet Take 200 mg by mouth every 6 (six) hours as needed.    No facility-administered encounter medications on file as of 10/29/2014.    Allergies  Allergen Reactions  . Codeine   . Penicillins   . Sulfa Antibiotics     Told as a child by her mother never to take  . Fish Allergy Diarrhea    Scaled fish   ROS:  The patient has intermittent anorexia, with some recent weight change noted.  She denies depression.  She denies headaches,  vision changes, decreased hearing, ear pain, sore throat, breast concerns, chest pain, palpitations, dizziness, syncope,cough, swelling, nausea, vomiting, diarrhea, constipation, abdominal pain, melena, hematochezia, indigestion/heartburn, hematuria, incontinence, dysuria, vaginal bleeding, discharge, odor or itch, genital lesions, joint pains (just the left back/flank, and the ankle injury, recent); has some hand arthiritis/stiffness; no numbness, tingling, weakness, tremor, suspicious skin lesions, depression, anxiety, abnormal bleeding/bruising, or enlarged lymph nodes. "bad migraine" the other day; rare headaches. SK's--many, some  have been removed and recurred. No significant/recent changes. See HPI for other complaints.  PHYSICAL EXAM:  BP 130/72 mmHg  Pulse 80  Ht 5' 3.25" (1.607 m)  Wt 104 lb 12.8 oz (47.537 kg)  BMI 18.41 kg/m2  General Appearance:    Alert, cooperative, no distress, appears stated age  Head:    Normocephalic, without obvious abnormality, atraumatic  Eyes:    PERRL, conjunctiva/corneas clear, EOM's intact, fundi    benign  Ears:    Normal TM's and external ear canals  Nose:   Nares normal, mucosa normal, no drainage or sinus   tenderness  Throat:   Lips, mucosa, and tongue normal; upper and lower dentures.  Neck:  Supple, no lymphadenopathy;  thyroid:  no   enlargement/tenderness/nodules; no carotid   bruit or JVD  Back:    Spine nontender, no curvature, ROM normal, no CVA     tenderness  Lungs:     Clear to auscultation bilaterally without wheezes, rales or     ronchi; respirations unlabored  Chest Wall:    No tenderness or deformity   Heart:    Regular rate and rhythm, S1 and S2 normal, no murmur, rub   or gallop  Breast Exam:    No tenderness, masses, or nipple discharge or inversion.      No axillary lymphadenopathy. Fibroglandular changes bilaterally,more noticeable on the left than the right.  She was tender somewhat diffusely along this tissue on the left.  Focal area of fibroglandular change on the right at 4 o'clock that was slightly tender   Abdomen:     Soft, non-tender, nondistended, normoactive bowel sounds,    no masses, no hepatosplenomegaly  Genitalia:    Normal external genitalia without lesions. Atrophic changes noted.  BUS and vagina normal; cervix without lesions, or cervical motion tenderness. No abnormal vaginal discharge.  Uterus and adnexa not enlarged, nontender, no masses.  Pap performed  Rectal:    Normal tone, no masses or tenderness; guaiac negative stool  Extremities:   No clubbing, cyanosis or edema. Onychyomycosis. All toes on the right, only on the 4th on  the left. Dry, calloused feet  Pulses:   2+ and symmetric all extremities  Skin:   Skin color, texture, turgor normal, no rashes or lesions  Lymph nodes:   Cervical, supraclavicular, and axillary nodes normal  Neurologic:   CNII-XII intact, normal strength, sensation and gait; reflexes 2+ and symmetric throughout          Psych:   Normal mood, affect, hygiene and grooming.     ASSESSMENT/PLAN:  Medicare annual wellness visit, initial - Plan: Lipid panel  Family history of heart disease - Plan: Lipid panel  Osteopenia - due for repeat DEXA in 04/2015 - Plan: Vit D  25 hydroxy (rtn osteoporosis monitoring)  Vitamin D deficiency - reviewed recommendations for daily supplement (to be adjusted based on levels) - Plan: Vit D  25 hydroxy (rtn osteoporosis monitoring)  Other fatigue - Plan: Comprehensive metabolic panel, CBC with Differential/Platelet, TSH, Vit D  25 hydroxy (rtn osteoporosis monitoring)  Screening for lipoid disorders - Plan: Lipid panel  Screening for diabetes mellitus - Plan: Comprehensive metabolic panel  Encounter for routine gynecological examination - Plan: Cytology - PAP [place]  Chronic obstructive pulmonary disease, unspecified COPD, unspecified chronic bronchitis type - stable/controlled   Osteopenia Due for another DEXA in 04/2015  Discussed monthly self breast exams and yearly mammograms; at least 30 minutes of aerobic activity at least 5 days/week and weight-bearing exercise 2x/week; proper sunscreen use reviewed; healthy diet, including goals of calcium and vitamin D intake and alcohol recommendations (less than or equal to 1 drink/day) reviewed; regular seatbelt use; changing batteries in smoke detectors.  Immunization recommendations discussed--due for TdaP (will check into cost, getting from pharmacy); needs another pneumonia vaccine--get records. Unsure if she had pneumovax or prevnar-13, needs the other. Yearly flu shots in the Fall.  Colonoscopy  recommendations reviewed--check with Dr. Collene Mares to see the date of last one, and when she is due again.  Given forms for Living Will and Healthcare power of attorney, and advised her to get [place] copies when completed Discussed her wishes--Full Code, Full care.  Medicare Attestation  I have personally reviewed: The patient's medical and social history Their use of alcohol, tobacco or illicit drugs Their current medications and supplements The patient's functional ability including ADLs,fall risks, home safety risks, cognitive, and hearing and visual impairment Diet and physical activities Evidence for depression or mood disorders  The patient's weight, height, BMI, and visual acuity have been recorded in the chart.  I have made referrals, counseling, and provided education to the patient based on review of the above and I have provided the patient with a written personalized care plan for preventive services.     Shlok Raz A, MD   10/29/2014      You are in need of a TdaP vaccine.  Medicare doesn't pay for this, but your secondary insurance or pharmacy benefit may cover it (for you to get at the pharmacy).  If it is not covered, we are happy to give it here, but you will be charged for it--check with your insurance to see if you can get at pharmacy vs here.  You are in need of a pneumonia shot.  I don't know which one, because I'm not sure which you already had.  You need one pneumovax and one Prevnar-13.  Please check with the pharmacy that may have given it to you, and please get [place] the date and type of vaccine you had so that we can give you the other one.  These vaccines ARE covered, and we can give them to you in the office (or you can get at the pharmacy, just make sure that we get the date/type so we can put it in your chart).    We need to get your other immunization records as well. Please look into getting these from prior doctors.  Please call Dr. Lorie Apley office and see when your last  colonoscopy was, and when you are due (and see if they can send [place] a copy of your last one, since we don't have any of your records).  [facility] the [facility] and schedule a routine mammogram (and give them Dr. Johnsie Kindred name).

## 2014-10-30 DIAGNOSIS — M8580 Other specified disorders of bone density and structure, unspecified site: Secondary | ICD-10-CM | POA: Insufficient documentation

## 2014-10-30 LAB — VITAMIN D 25 HYDROXY (VIT D DEFICIENCY, FRACTURES): VIT D 25 HYDROXY: 27 ng/mL — AB (ref 30–100)

## 2014-10-31 LAB — CYTOLOGY - PAP

## 2014-11-10 ENCOUNTER — Other Ambulatory Visit: Payer: Self-pay | Admitting: Family Medicine

## 2015-01-14 DIAGNOSIS — H401131 Primary open-angle glaucoma, bilateral, mild stage: Secondary | ICD-10-CM | POA: Diagnosis not present

## 2015-01-14 DIAGNOSIS — H35373 Puckering of macula, bilateral: Secondary | ICD-10-CM | POA: Diagnosis not present

## 2015-01-26 ENCOUNTER — Ambulatory Visit (INDEPENDENT_AMBULATORY_CARE_PROVIDER_SITE_OTHER): Payer: Medicare Other | Admitting: Family Medicine

## 2015-01-26 ENCOUNTER — Encounter: Payer: Self-pay | Admitting: Family Medicine

## 2015-01-26 VITALS — BP 100/70 | HR 72 | Temp 97.0°F | Resp 12 | Ht 64.0 in | Wt 100.2 lb

## 2015-01-26 DIAGNOSIS — Z780 Asymptomatic menopausal state: Secondary | ICD-10-CM | POA: Diagnosis not present

## 2015-01-26 DIAGNOSIS — R636 Underweight: Secondary | ICD-10-CM

## 2015-01-26 DIAGNOSIS — R252 Cramp and spasm: Secondary | ICD-10-CM

## 2015-01-26 DIAGNOSIS — N3000 Acute cystitis without hematuria: Secondary | ICD-10-CM

## 2015-01-26 DIAGNOSIS — M8580 Other specified disorders of bone density and structure, unspecified site: Secondary | ICD-10-CM | POA: Diagnosis not present

## 2015-01-26 DIAGNOSIS — Z23 Encounter for immunization: Secondary | ICD-10-CM | POA: Diagnosis not present

## 2015-01-26 LAB — POCT URINALYSIS DIPSTICK
BILIRUBIN UA: NEGATIVE
Glucose, UA: NEGATIVE
Ketones, UA: NEGATIVE
Spec Grav, UA: 1.025
Urobilinogen, UA: NEGATIVE
pH, UA: 6

## 2015-01-26 MED ORDER — CIPROFLOXACIN HCL 500 MG PO TABS: 500.0000 mg | ORAL_TABLET | Freq: Two times a day (BID) | ORAL | Status: DC

## 2015-01-26 NOTE — Patient Instructions (Signed)
  Restart your calcium and Vitamin D supplements.  You should be getting between 1200-1500mg  of calcium daily between all sources--milk/yogurt/greens, multivitamin, and any calcium supplements.  The vitamin D should be 1000 IU daily (can be more, if you have the D in your calcium supplement and multivitamin).  If you don't get a call to schedule your bone density for January, then you can call the [facility] directly (order will be in the computer).  Continue to try and get weight bearing exercise.  We are prescribing cipro for your bladder infection.  Please call us if your symptoms are not improving. You may continue the urestat for only 2 days, if needed. Contact us if you develop high fever, flank pain, vomiting, or any reaction to the antibiotic. We should have the culture results later this week and will contact you when available. Try and drink plenty of fluids. Consider re-trying the white bar of soap to help with your leg cramps.

## 2015-01-26 NOTE — Progress Notes (Signed)
Chief Complaint  Patient presents with  . Possible UTI    since friday. 10/10 pain. tried urostat for pain and cranberry juice. hurts to be up and moving around.  . leg cramps    primarily in the lower calves and ankles (both legs). going on for a couple of months.   2 days ago she developed pain with urination, and lower abdominal pressure. She has also noticed odor to the urine. She noticed a slight streak of blood with wiping just once, but not since then.  Intermittently having urinary frequency and urgency.  She took Radiographer, therapeutic, which temporarily helped some.  Last dose was last night.  She has had some chills, no known fevers.  She is having pain across the entire lower abdomen, as well as the lower back. Pain does not extend up to the flanks.  Last UTI was 09/2013, citrobacter (treated with Cipro).  She is also complaining of leg cramps.  This has been ongoing for a while.  Cramps are in the ankle area, and into the calves, in both legs, L>R.  She knows she doesn't drink enough water. Cramps are mostly at night, sometimes during the day. She previously had benefit from putting a white bar of soap   Osteopenia--she hasn't been taking calcium supplement, has been taking a multivitamin.  Osteopenia--T-2.4 04/2013.  Due again for repeat in 04/2015  PMH, PSH, SH reviewed.  Outpatient Encounter Prescriptions as of 01/26/2015  Medication Sig Note  . acetaminophen (TYLENOL) 325 MG tablet Take 650 mg by mouth every 6 (six) hours as needed.   Marland Kitchen albuterol (PROVENTIL HFA;VENTOLIN HFA) 108 (90 BASE) MCG/ACT inhaler Inhale 2 puffs into the lungs every 6 (six) hours as needed for wheezing or shortness of breath.   Marland Kitchen aspirin 81 MG tablet Take 81 mg by mouth daily.   Marland Kitchen ibuprofen (ADVIL,MOTRIN) 200 MG tablet Take 200 mg by mouth every 6 (six) hours as needed.   . latanoprost (XALATAN) 0.005 % ophthalmic solution Place 1 drop into both eyes at bedtime.  08/06/2014: Received from: External Pharmacy  . Multiple  Vitamin (MULTIVITAMIN) capsule Take 1 capsule by mouth daily.     Marland Kitchen SPIRIVA HANDIHALER 18 MCG inhalation capsule PLACE 1 CAPSULE INTO INHALER AND INHALE THE CONTENTS DAILY    No facility-administered encounter medications on file as of 01/26/2015.   Allergies  Allergen Reactions  . Codeine   . Penicillins   . Sulfa Antibiotics     Told as a child by her mother never to take  . Fish Allergy Diarrhea    Scaled fish   ROS: Some chills. No fevers, nausea, vomiting, diarrhea.  +abdominal and lower back pain as per HPI. +leg cramps Some headaches (has some slight headache at her temples).  Denies any congestion, cough (just baseline from her COPD), shortness of breath, chest pain. No rashes, bleeding, bruising. Some weight loss; appetite is okay, sometimes slightly decreased.  No bowel changes. Denies depression or other concerns.  PHYSICAL EXAM:  BP 100/70 mmHg  Pulse 72  Temp(Src) 97 F (36.1 C) (Tympanic)  Resp 12  Ht 5\' 4"  (1.626 m)  Wt 100 lb 3.2 oz (45.45 kg)  BMI 17.19 kg/m2  Pleasant, thin, elderly female in no distress HEENT: PERRL, EOMI, conjunctiva clear. OP with moist mucus membranes Neck: no lymphadenopathy, thyromegaly or mass Heart: regular rate and rhythm without murmur Lungs: Coarse BS at left base, otherwise clear, no rales, ronchi or wheezes Abdomen: Tender across lower abdomen. No rebound, guarding,  mass or organomegaly Back: No CVA tenderness Extremities: no edema, warm, normal pulse.  Psych: normal mood, affect, hygiene and grooming Neuro: alert and oriented, normal cranial nerves, gait  Urine dip: 2+ leuks, +nitr, 3+ blood, 1+ protein  ASSESSMENT/PLAN:  Acute cystitis without hematuria - treat with cipro; culture sent; change abx if needed based on culture - Plan: ciprofloxacin (CIPRO) 500 MG tablet, Urine culture  UTI (urinary tract infection) - Plan: POCT urinalysis dipstick  Cramp of both lower extremities - increase fluid/water intake. re-try white  bar of soap. consider electrolytes if persistent/worsening  Osteopenia - discussed calcium, vitamin D and weight-bearing exercise; repeat DEXA in January - Plan: DG Bone Density  Underweight - discussed caloric intake--snacks, Ensure prn if decreased appetite - Plan: DG Bone Density  Postmenopausal estrogen deficiency - Plan: DG Bone Density  Need for prophylactic vaccination and inoculation against influenza - Plan: Flu vaccine HIGH DOSE PF   Restart your calcium and Vitamin D supplements.  You should be getting between 1200-1500mg  of calcium daily between all sources--milk/yogurt/greens, multivitamin, and any calcium supplements.  The vitamin D should be 1000 IU daily (can be more, if you have the D in your calcium supplement and multivitamin).  If you don't get a call to schedule your bone density for January, then you can call the [facility] directly (order will be in the computer).  Continue to try and get weight bearing exercise.  We are prescribing cipro for your bladder infection.  Please call us if your symptoms are not improving. You may continue the urestat for only 2 days, if needed. Contact us if you develop high fever, flank pain, vomiting, or any reaction to the antibiotic. We should have the culture results later this week and will contact you when available. Try and drink plenty of fluids. Consider re-trying the white bar of soap to help with your leg cramps.

## 2015-01-28 LAB — URINE CULTURE: Colony Count: >=100000

## 2015-02-06 ENCOUNTER — Other Ambulatory Visit: Payer: Self-pay | Admitting: Family Medicine

## 2015-02-06 NOTE — Telephone Encounter (Signed)
Is this okay to refill? 

## 2015-04-15 DIAGNOSIS — H401131 Primary open-angle glaucoma, bilateral, mild stage: Secondary | ICD-10-CM | POA: Diagnosis not present

## 2015-05-06 ENCOUNTER — Other Ambulatory Visit: Payer: Self-pay | Admitting: Family Medicine

## 2015-05-06 NOTE — Telephone Encounter (Signed)
Scheduled patient for med check 06/03/15 @ 3:15pm, she did not actually need the refill. And scheduled her for med check 12/09/15 @ 9:45am.

## 2015-05-06 NOTE — Telephone Encounter (Signed)
I think she should be scheduled for a med check now; we haven't done spirometry, which should be done when on Spiriva.  Set up a med check for soon, at which time we will do spirometry, and also her AWV/med check in July/Aug

## 2015-05-06 NOTE — Telephone Encounter (Signed)
Is this okay to refill? Looks like she does not have any future appts scheduled. Due for med check plus 10/2015, I can call her and get one scheduled.

## 2015-06-03 ENCOUNTER — Encounter: Payer: Self-pay | Admitting: Family Medicine

## 2015-06-03 ENCOUNTER — Ambulatory Visit (INDEPENDENT_AMBULATORY_CARE_PROVIDER_SITE_OTHER): Payer: Medicare Other | Admitting: Family Medicine

## 2015-06-03 VITALS — BP 130/74 | HR 76 | Ht 64.0 in | Wt 101.4 lb

## 2015-06-03 DIAGNOSIS — R109 Unspecified abdominal pain: Secondary | ICD-10-CM

## 2015-06-03 DIAGNOSIS — R634 Abnormal weight loss: Secondary | ICD-10-CM | POA: Diagnosis not present

## 2015-06-03 DIAGNOSIS — J441 Chronic obstructive pulmonary disease with (acute) exacerbation: Secondary | ICD-10-CM

## 2015-06-03 MED ORDER — FLUTICASONE FUROATE 100 MCG/ACT IN AEPB: 1.0000 | INHALATION_SPRAY | Freq: Every day | RESPIRATORY_TRACT | Status: DC

## 2015-06-03 NOTE — Patient Instructions (Signed)
  Drink more water, as we discussed.  [facility] and schedule your bone density test and your mammogram. Please buy Vitamin D3 1000 IU and start taking it every day, in addition to your multivitamin. Please also make sure that you are getting at least 1200mg  of calcium daily, through your diet and supplements combined. Try and get weight-bearing exercise at least 2-3 times/week to help build your bones. Try and get at least 150 minutes of aerobic exercise (brisk walking) each week, to keep your heart healthy  Please try and find out when your last colonoscopy was (and have her office send [place] the report), so we can know when you are due again.  We don't necessarily have to do another colonoscopy when due, depending on if you had any polyps on prior colonoscopies-- we can consider Cologard. (fecal DNA test).  Please check with your pharmacies to see which vaccines they have given you.  You report having had one pneumonia vaccine--you need the other one, but I'm not sure which one you had. You also need to have a tetanus shot.  These are not covered by medicare, but might be covered if you get it through the pharmacy.  I'm giving you a written prescription to try and get it from the pharmacy.  I'd love to know the date of your shingles vaccine, and the date/type of your pneumonia vaccine.  Go to [facility] to get chest x-ray at your convenience.  Continue the spiriva since you already have another 3 months of medication. Add the [place].  This is a steroid--be sure to rinse/gargle after using. You were given a sample for 2 weeks, and a voucher for 1 free month. prescription was sent for 3 months, to last until we see you again, and re-evaluate.

## 2015-06-03 NOTE — Progress Notes (Signed)
Chief Complaint  Patient presents with  . COPD    med check and PFT's today.   . Eye Problem    left eye has a bump on eyelid x 3 weeks, did do warm compresses and it did "pop," but has returned. Thinks she may need to see Dr.Gould but would lieke your opinion.     COPD: Breathing is good. She uses Spiriva daily without side effects.  She still reports that taking trash cans up the hill causes some shortness of breath, relieved by 5 minutes of rest. She only needs the albuterol very sporadically--[place], but otherwise never. She didn't have significant breathing problems when walking 2 miles/day while in [place]. She just recently refilled Spiriva x 3 months.  She still has left sided flank/low back pain, pain subsides after drinking more water. Admits to not drinking enough water. Denies any urinary complaints.  10# wt lost in the last 5 years, with some gradual/ongoing loss.  She feels like her weight is down since she lost her husband.  She feels like her appetite is normal, energy is good.  She walked 2 miles/day when visiting her sister in [facility] (August) for six weeks (after sister broke her ankle), and she felt great.  Last CXR 05/2007 Findings: Trachea is midline. Heart size within normal limits. Thoracic aorta is calcified. Mild prominence of the ascending portion of the thoracic aorta. Scarring in the right lower lobe. Lungs otherwise clear. No pleural fluid.  IMPRESSION:  No acute findings.    Vitamin D was a little low at 27 when last checked in July. She admits she never started the extra Vitamin D3 1000 daily, just continued the multi-vitamin.  She hasn't yet scheduled bone density or mammogram. We still haven't gotten documentation of her immunizations yet (from [place], or which pneumonia vaccine she reports getting from pharmacy).  Immunization History  Administered Date(s) Administered  . Influenza, High Dose Seasonal PF  01/26/2015   PMH, PSH, SH reviewed.  Outpatient Encounter Prescriptions as of 06/03/2015  Medication Sig Note  . aspirin 81 MG tablet Take 81 mg by mouth daily.   Marland Kitchen latanoprost (XALATAN) 0.005 % ophthalmic solution Place 1 drop into both eyes at bedtime.  08/06/2014: Received from: External Pharmacy  . Multiple Vitamin (MULTIVITAMIN) capsule Take 1 capsule by mouth daily.     Marland Kitchen SPIRIVA HANDIHALER 18 MCG inhalation capsule PLACE 1 CAPSULE INTO INHALER AND INHALE THE CONTENTS DAILY   . acetaminophen (TYLENOL) 325 MG tablet Take 650 mg by mouth every 6 (six) hours as needed. Reported on 06/03/2015   . albuterol (PROVENTIL HFA;VENTOLIN HFA) 108 (90 BASE) MCG/ACT inhaler Inhale 2 puffs into the lungs every 6 (six) hours as needed for wheezing or shortness of breath. (Patient not taking: Reported on 06/03/2015)   . ibuprofen (ADVIL,MOTRIN) 200 MG tablet Take 200 mg by mouth every 6 (six) hours as needed. Reported on 06/03/2015   . [DISCONTINUED] ciprofloxacin (CIPRO) 500 MG tablet Take 1 tablet (500 mg total) by mouth 2 (two) times daily.    No facility-administered encounter medications on file as of 06/03/2015.   Allergies  Allergen Reactions  . Codeine   . Penicillins   . Sulfa Antibiotics     Told as a child by her mother never to take  . Fish Allergy Diarrhea    Scaled fish   ROS:  No fever, chills, recent headaches, URI symptoms, dizziness, chest pain, palpitations, shortness of breath, nausea,  vomiting, diarrhea, bleeding, bruising, rashes. Denies depression.  See HPI.  PHYSICAL EXAM: BP 130/74 mmHg  Pulse 76  Ht 5\' 4"  (1.626 m)  Wt 101 lb 6.4 oz (45.995 kg)  BMI 17.40 kg/m2 Very thin, pleasant female, in good spirits, in no distress HEENT: PERRL, EOMI, conjunctiva and sclera are clear. OP is clear Neck: no lymphadenopathy, thyromegaly or mass Heart: regular rate and rhythm, no murmur Lungs: clear bilaterally. No wheezes, rales, ronchi Abdomen: soft, nontender, no  mass Extremities: no edema Neuro: alert and oriented, cranial nerves intact, normal strength, gait Psych: normal mood, affect, hygiene and grooming  ASSESSMENT/PLAN:  COPD exacerbation (HCC) - abnormal spirometry despite spiriva use. Add Arnuity 100 mcg. consider change to different combo med at f/u (use up spiriva) - Plan: Spirometry with graph, DG Chest 2 View, Fluticasone Furoate (ARNUITY ELLIPTA) 100 MCG/ACT AEPB, CANCELED: Spirometry with graph  Left flank pain - check CXR; drink more water - Plan: DG Chest 2 View  Loss of weight - DDx reviewed; routine cancer screening/HM needs to be updated. adequate caloric intake discussed, snacks. - Plan: DG Chest 2 View   COPD:  Spirometry showed moderately severe obstruction. Has 3 months of spiriva left that she just got, wants to use. Add Arnuity 150mcg. Discussed proper use, rinsing mouth.  Recheck PFT's in 3 months (and weight, f/u imms, mammo, DEXA) Consider changing combination therapy depending on repeat spirometry, if any benefit.  Discussed ways to increase hydration Reminded to schedule DEXA, mammo, get immunization records TdaP written rx given to get at pharmacy.   Drink more water, as we discussed.  [facility] and schedule your bone density test and your mammogram. Please buy Vitamin D3 1000 IU and start taking it every day, in addition to your multivitamin. Please also make sure that you are getting at least 1200mg  of calcium daily, through your diet and supplements combined. Try and get weight-bearing exercise at least 2-3 times/week to help build your bones. Try and get at least 150 minutes of aerobic exercise (brisk walking) each week, to keep your heart healthy  Please try and find out when your last colonoscopy was (and have her office send [place] the report), so we can know when you are due again.  We don't necessarily have to do another colonoscopy when due, depending on if you had any polyps on prior  colonoscopies-- we can consider Cologard. (fecal DNA test).  Please check with your pharmacies to see which vaccines they have given you.  You report having had one pneumonia vaccine--you need the other one, but I'm not sure which one you had. You also need to have a tetanus shot.  These are not covered by medicare, but might be covered if you get it through the pharmacy.  I'm giving you a written prescription to try and get it from the pharmacy.  I'd love to know the date of your shingles vaccine, and the date/type of your pneumonia vaccine.  Go to [facility] to get chest x-ray at your convenience.  Continue the spiriva since you already have another 3 months of medication. Add the [place].  This is a steroid--be sure to rinse/gargle after using. You were given a sample for 2 weeks, and a voucher for 1 free month. prescription was sent for 3 months, to last until we see you again, and re-evaluate.

## 2015-06-06 ENCOUNTER — Telehealth: Payer: Self-pay | Admitting: Family Medicine

## 2015-06-06 DIAGNOSIS — J449 Chronic obstructive pulmonary disease, unspecified: Secondary | ICD-10-CM

## 2015-06-09 NOTE — Telephone Encounter (Signed)
P.A. Isabelle Course, pt needs trial and failure of Flovent Diskus or Flovent HFA Do you want to switch?

## 2015-06-10 MED ORDER — FLUTICASONE PROPIONATE HFA 110 MCG/ACT IN AERO: 1.0000 | INHALATION_SPRAY | Freq: Two times a day (BID) | RESPIRATORY_TRACT | Status: DC

## 2015-06-10 NOTE — Telephone Encounter (Signed)
rx sent

## 2015-06-10 NOTE — Telephone Encounter (Signed)
Patient said she is not interested in doing the study. Please switch medication for her. Thanks.

## 2015-06-10 NOTE — Telephone Encounter (Signed)
I think this patient might actually be a candidate for the Pharmquest COPD study.  Please call pt and see if interested (she would gets meds free).  If she isn't interested (or ends up not being a candidate), then we can switch meds

## 2015-06-18 ENCOUNTER — Other Ambulatory Visit: Payer: Self-pay

## 2015-06-18 DIAGNOSIS — Z1231 Encounter for screening mammogram for malignant neoplasm of breast: Secondary | ICD-10-CM

## 2015-06-22 ENCOUNTER — Ambulatory Visit (INDEPENDENT_AMBULATORY_CARE_PROVIDER_SITE_OTHER): Payer: Medicare Other | Admitting: Family Medicine

## 2015-06-22 ENCOUNTER — Encounter: Payer: Self-pay | Admitting: Family Medicine

## 2015-06-22 VITALS — BP 138/70 | HR 84 | Ht 63.0 in | Wt 100.2 lb

## 2015-06-22 DIAGNOSIS — M5431 Sciatica, right side: Secondary | ICD-10-CM

## 2015-06-22 MED ORDER — KETOROLAC TROMETHAMINE 60 MG/2ML IM SOLN
60.0000 mg | Freq: Once | INTRAMUSCULAR | Status: AC
  Administered 2015-06-22: 60 mg via INTRAMUSCULAR

## 2015-06-22 MED ORDER — MELOXICAM 15 MG PO TABS
15.0000 mg | ORAL_TABLET | Freq: Every day | ORAL | Status: DC
Start: 2015-06-22 — End: 2015-07-09

## 2015-06-22 NOTE — Patient Instructions (Addendum)
   We are giving you an injection of ketorolac today.  This is an anti-inflammatory medication.  You need to wait 6 hours after this injection before taking any other anti-inflammatory (ie ibuprofen or the new prescription). If your pain is still bad prior to the 6 hours, feel free to use tylenol (acetaminophen).  It is up to you whether you want to start the once daily meloxicam this evening, vs take ibuprofen this evening and start the meloxicam once daily in the morning, with food.  If you aren't seeing significant improvement in your pain after 5-7 days of the daily precription medication, call for a prescription for a steroid (vs returning for a steroid injection).  Take your prescribed anti-inflammatory medication with food; discontinue or cut back the dose if you develop stomach pain/discomfort/side effects.  Do not take other over-the-counter pain medications such as ibuprofen, advil, motrin, aleve, naproxen, Goody or BC or excedrin at the same time.  Do not use longer than recommended.  It is okay to use acetaminophen (tylenol) along with this medication.  Resume prior home exercise regimen.

## 2015-06-22 NOTE — Progress Notes (Signed)
Chief Complaint  Patient presents with  . Sciatica    right sided sciatic pain. Had on the left side about 3 years ago while at [place]. Did PT and it did not work so they did cortisone shots and helped-believes she had two.   . Other    did check with Dr.Mann's office and she is not due for colonoscopy until 2018.    10-14 days ago she developed right sided pain starting at the right buttock/hip, and radiates down the side of her leg, down to the ankle.  She has right lateral ankle discomfort, feels "cold". Denies numbness/tingling/burning, denies weaknes.  Denies any known injury or trigger--she had been standing a lot at church, and some yardwork.  She has been taking ibuprofen (starting at 2 at a time, then increased up to 3 (600mg ) just recently, and last night took 800mg . Taking medication three times daily, the higher doses only for the last 2-3 days.  Has also tried tylenol.  Ibuprofen seems to provide some benefit. Pain is constant, not intermittent, present at rest and with activity.  Walking relieves it temporarily, but then it recurs. She can't lay flat on her back.  No pain sleeping on the right side. Currently describes pain as 10-11/10  Had a similar problem on the left side about 3 years ago.  She recalls getting two injections into the buttock area in order to get relief.  No bower or bladder dysfunction, fever, chills. Denies any abdominal pain or side effects from the ibuprofen She reports that Aleve made her feel very tense, "overall not good feeling".   02/2006 lumbar spine series: LUMBAR SPINE: Four views of the lumbar spine were obtained. The lumbar vertebrae are in normal alignment with normal intervertebral disk spaces. On oblique views, there is degenerative change involving the facet joints, particularly at L4-5 and L5-S1. No compression deformity is seen. The SI joints appear normal. There is a 5 mm calcification overlying the right kidney consistent with a right renal  calculus.  IMPRESSION:  1. Normal alignment. 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  2. 5 mm right renal calculus.  PMH, PSH, SH reviewed.  Outpatient Encounter Prescriptions as of 06/22/2015  Medication Sig Note  . aspirin 81 MG tablet Take 81 mg by mouth daily.   . fluticasone (FLOVENT HFA) 110 MCG/ACT inhaler Inhale 1 puff into the lungs 2 (two) times daily. Rinse mouth after using   . ibuprofen (ADVIL,MOTRIN) 200 MG tablet Take 800 mg by mouth every 6 (six) hours as needed. Reported on 06/03/2015 06/22/2015: Last dose last night.  . latanoprost (XALATAN) 0.005 % ophthalmic solution Place 1 drop into both eyes at bedtime.  08/06/2014: Received from: External Pharmacy  . Multiple Vitamin (MULTIVITAMIN) capsule Take 1 capsule by mouth daily.     Marland Kitchen SPIRIVA HANDIHALER 18 MCG inhalation capsule PLACE 1 CAPSULE INTO INHALER AND INHALE THE CONTENTS DAILY   . acetaminophen (TYLENOL) 325 MG tablet Take 650 mg by mouth every 6 (six) hours as needed. Reported on 06/22/2015   . albuterol (PROVENTIL HFA;VENTOLIN HFA) 108 (90 BASE) MCG/ACT inhaler Inhale 2 puffs into the lungs every 6 (six) hours as needed for wheezing or shortness of breath. (Patient not taking: Reported on 06/03/2015)    No facility-administered encounter medications on file as of 06/22/2015.   Allergies  Allergen Reactions  . Codeine   . Penicillins   . Sulfa Antibiotics     Told as a child by her mother  never to take  . Fish Allergy Diarrhea    Scaled fish   ROS: no fever, chills, chest pain, shortness of breath, nausea, vomiting, abdominal pain, bowel changes, bleeding, bruising, rash. No urinary complaints.  No numbness/tingling/weakness. +radicular pain into the right leg.  No actual back pain, just buttock/hip.  Moods are good  PHYSICAL EXAM: BP 138/70 mmHg  Pulse 84  Ht 5\' 3"  (1.6 m)  Wt 100 lb 3.2 oz (45.45 kg)  BMI 17.75 kg/m2  Well developed, pleasant female, who does not appear to be in  significant distress (despite describing pain as 10-11/10). Back: no CVA tenderness. Lumbar spine: nontender Tender at R SI joint, and palpating this area causes pain to shoot down the leg. She is also tender at the R sciatic notch and deep buttock muscles, fairly diffusely, also triggering pain down the right posterior leg. No pyriformis spasm (decreased ROM on the left, full on the right--causing some lateral hip pain with this stretch, L>R) Negative straight leg raise. Normal strength, sensation, DTR's symmetric.  ASSESSMENT/PLAN:  Sciatica of right side - Plan: meloxicam (MOBIC) 15 MG tablet, ketorolac (TORADOL) injection 60 mg   toradol 60mg  IM given today Discussed NSAIDs vs steroids (oral vs injection) at length today--risks/benefits, side effects.  Given that she got some benefit from the ibuprofen, trial of NSAIDs first.  Can return for injection vs call for oral steroids if not improving on this regimen.  Consider PT again.    We are giving you an injection of ketorolac today.  This is an anti-inflammatory medication.  You need to wait 6 hours after this injection before taking any other anti-inflammatory (ie ibuprofen or the new prescription). If your pain is still bad prior to the 6 hours, feel free to use tylenol (acetaminophen).  It is up to you whether you want to start the once daily meloxicam this evening, vs take ibuprofen this evening and start the meloxicam once daily in the morning, with food.  If you aren't seeing significant improvement in your pain after 5-7 days of the daily precription medication, call for a prescription for a steroid (vs returning for a steroid injection).   Resume prior home exercise regimen.

## 2015-06-24 ENCOUNTER — Telehealth: Payer: Self-pay | Admitting: Family Medicine

## 2015-06-24 NOTE — Telephone Encounter (Signed)
Advise--This is not usually a common side effect, but there is no harm (other than her having ongoing pain) if she wants to hold off on taking it for a day or two, and re-try if/when diarrhea resolves (if pain persists).

## 2015-06-24 NOTE — Telephone Encounter (Signed)
Patient advised.

## 2015-06-24 NOTE — Telephone Encounter (Signed)
Pt called and states that the injection ketorolac gave her loose stools that night and then she tool the pill meloxicam the next morning 06/23/2015 and is also gave her loose stools and has not taking any more, she is not for sure what to do, and states she had some loose stools this morning also. She wanted to know what to do before she took any more pills, pt can be reached at [phone] (H)  Can also call her  Cell [phone] said she has to run out for a bit, wanted my advice on whether or not to take any more but i did advise her that i am not a nurse or the dr so it was her call, on what to do, said she was going to wait to hear from you before she takes any more, please advise.

## 2015-06-30 ENCOUNTER — Ambulatory Visit
Admission: RE | Admit: 2015-06-30 | Discharge: 2015-06-30 | Disposition: A | Discharge status: Home / Self Care | Payer: Medicare Other | Source: Ambulatory Visit | Attending: Family Medicine | Admitting: Family Medicine

## 2015-06-30 ENCOUNTER — Other Ambulatory Visit: Payer: Self-pay | Admitting: Family Medicine

## 2015-06-30 ENCOUNTER — Ambulatory Visit
Admission: RE | Admit: 2015-06-30 | Discharge: 2015-06-30 | Disposition: A | Discharge status: Home / Self Care | Payer: Medicare Other | Source: Ambulatory Visit

## 2015-06-30 DIAGNOSIS — M810 Age-related osteoporosis without current pathological fracture: Secondary | ICD-10-CM | POA: Diagnosis not present

## 2015-06-30 DIAGNOSIS — M8580 Other specified disorders of bone density and structure, unspecified site: Secondary | ICD-10-CM

## 2015-06-30 DIAGNOSIS — N644 Mastodynia: Secondary | ICD-10-CM

## 2015-06-30 DIAGNOSIS — Z780 Asymptomatic menopausal state: Secondary | ICD-10-CM

## 2015-06-30 DIAGNOSIS — Z1231 Encounter for screening mammogram for malignant neoplasm of breast: Secondary | ICD-10-CM

## 2015-06-30 DIAGNOSIS — R636 Underweight: Secondary | ICD-10-CM

## 2015-07-09 ENCOUNTER — Encounter: Payer: Self-pay | Admitting: Family Medicine

## 2015-07-09 ENCOUNTER — Ambulatory Visit (INDEPENDENT_AMBULATORY_CARE_PROVIDER_SITE_OTHER): Payer: Medicare Other | Admitting: Family Medicine

## 2015-07-09 VITALS — BP 146/80 | HR 76 | Ht 63.0 in | Wt 102.6 lb

## 2015-07-09 DIAGNOSIS — M810 Age-related osteoporosis without current pathological fracture: Secondary | ICD-10-CM | POA: Diagnosis not present

## 2015-07-09 DIAGNOSIS — M5431 Sciatica, right side: Secondary | ICD-10-CM | POA: Diagnosis not present

## 2015-07-09 DIAGNOSIS — J449 Chronic obstructive pulmonary disease, unspecified: Secondary | ICD-10-CM

## 2015-07-09 MED ORDER — MELOXICAM 15 MG PO TABS: 15.0000 mg | ORAL_TABLET | Freq: Every day | ORAL | Status: DC

## 2015-07-09 NOTE — Patient Instructions (Addendum)
Raloxifene tablets (Evista) What is this medicine? RALOXIFENE (ral OX i feen) reduces the amount of calcium lost from bones. It is used to treat and prevent osteoporosis in women who have experienced menopause. This medicine may be used for other purposes; ask your health care provider or pharmacist if you have questions. What should I tell my health care provider before I take this medicine? They need to know if you have any of these conditions: -a history of blood clots -cancer -heart failure -liver disease -premenopausal -an unusual or allergic reaction to raloxifene, other medicines, foods, dyes, or preservatives -pregnant or trying to get pregnant -breast-feeding How should I use this medicine? Take this medicine by mouth with a glass of water. Follow the directions on the prescription label. The tablets can be taken with or without food. Take your doses at regular intervals. Do not take your medicine more often than directed. Talk to your pediatrician regarding the use of this medicine in children. Special care may be needed. Overdosage: If you think you have taken too much of this medicine contact a poison control center or emergency room at once. NOTE: This medicine is only for you. Do not share this medicine with others. What if I miss a dose? If you miss a dose, take it as soon as you can. If it is almost time for your next dose, take only that dose. Do not take double or extra doses. What may interact with this medicine? -ampicillin -cholestyramine -colestipol -diazepam -diazoxide -female hormones like hormone replacement therapy -lidocaine -warfarin This list may not describe all possible interactions. Give your health care provider a list of all the medicines, herbs, non-prescription drugs, or dietary supplements you use. Also tell them if you smoke, drink alcohol, or use illegal drugs. Some items may interact with your medicine. What should I watch for while using this  medicine? Visit your doctor or health care professional for regular checks on your progress. Do not stop taking this medicine except on the advice of your doctor or health care professional. You should make sure you get enough calcium and vitamin D in your diet while you are taking this medicine. Discuss your dietary needs with your health care professional or nutritionist. Exercise may help to prevent bone loss. Discuss your exercise needs with your doctor or health care professional. This medicine can rarely cause blood clots. You should avoid long periods of bed rest while taking this medicine. If you are going to have surgery, tell your doctor or health care professional that you are taking this medicine. This medicine should be stopped at least 3 days before surgery. After surgery, it should be restarted only after you are walking again. It should not be restarted while you still need long periods of bed rest. You should not smoke while taking this medicine. Smoking may also increase your risk of blood clots. Smoking can also decrease the effects of this medicine. This medicine does not prevent hot flashes. It may cause hot flashes in some patients at the start of therapy. What side effects may I notice from receiving this medicine? Side effects that you should report to your doctor or health care professional as soon as possible: -change in vision -chest pain -difficulty breathing -leg pain or swelling -skin rash, itching Side effects that usually do not require medical attention (report to your doctor or health care professional if they continue or are bothersome): -fluid build-up -leg cramps -stomach pain -sweating This list may not describe all possible  side effects. Call your doctor for medical advice about side effects. You may report side effects to FDA at 1-800-FDA-1088. Where should I keep my medicine? Keep out of the reach of children. Store at room temperature between 15 and 30  degrees C (59 and 86 degrees F). Throw away any unused medicine after the expiration date. NOTE: This sheet is a summary. It may not cover all possible information. If you have questions about this medicine, talk to your doctor, pharmacist, or health care provider.    2016, Elsevier/Gold Standard. (2008-03-13 15:15:14)  Denosumab injection (prolia) What is this medicine? DENOSUMAB (den oh sue mab) slows bone breakdown. Prolia is used to treat osteoporosis in women after menopause and in men. Delton See is used to prevent bone fractures and other bone problems caused by cancer bone metastases. Delton See is also used to treat giant cell tumor of the bone. This medicine may be used for other purposes; ask your health care provider or pharmacist if you have questions. What should I tell my health care provider before I take this medicine? They need to know if you have any of these conditions: -dental disease -eczema -infection or history of infections -kidney disease or on dialysis -low blood calcium or vitamin D -malabsorption syndrome -scheduled to have surgery or tooth extraction -taking medicine that contains denosumab -thyroid or parathyroid disease -an unusual reaction to denosumab, other medicines, foods, dyes, or preservatives -pregnant or trying to get pregnant -breast-feeding How should I use this medicine? This medicine is for injection under the skin. It is given by a health care professional in a hospital or clinic setting. If you are getting Prolia, a special MedGuide will be given to you by the pharmacist with each prescription and refill. Be sure to read this information carefully each time. For Prolia, talk to your pediatrician regarding the use of this medicine in children. Special care may be needed. For Delton See, talk to your pediatrician regarding the use of this medicine in children. While this drug may be prescribed for children as young as 13 years for selected conditions,  precautions do apply. Overdosage: If you think you have taken too much of this medicine contact a poison control center or emergency room at once. NOTE: This medicine is only for you. Do not share this medicine with others. What if I miss a dose? It is important not to miss your dose. Call your doctor or health care professional if you are unable to keep an appointment. What may interact with this medicine? Do not take this medicine with any of the following medications: -other medicines containing denosumab This medicine may also interact with the following medications: -medicines that suppress the immune system -medicines that treat cancer -steroid medicines like prednisone or cortisone This list may not describe all possible interactions. Give your health care provider a list of all the medicines, herbs, non-prescription drugs, or dietary supplements you use. Also tell them if you smoke, drink alcohol, or use illegal drugs. Some items may interact with your medicine. What should I watch for while using this medicine? Visit your doctor or health care professional for regular checks on your progress. Your doctor or health care professional may order blood tests and other tests to see how you are doing. Call your doctor or health care professional if you get a cold or other infection while receiving this medicine. Do not treat yourself. This medicine may decrease your body's ability to fight infection. You should make sure you get enough calcium  and vitamin D while you are taking this medicine, unless your doctor tells you not to. Discuss the foods you eat and the vitamins you take with your health care professional. See your dentist regularly. Brush and floss your teeth as directed. Before you have any dental work done, tell your dentist you are receiving this medicine. Do not become pregnant while taking this medicine or for 5 months after stopping it. Women should inform their doctor if they  wish to become pregnant or think they might be pregnant. There is a potential for serious side effects to an unborn child. Talk to your health care professional or pharmacist for more information. What side effects may I notice from receiving this medicine? Side effects that you should report to your doctor or health care professional as soon as possible: -allergic reactions like skin rash, itching or hives, swelling of the face, lips, or tongue -breathing problems -chest pain -fast, irregular heartbeat -feeling faint or lightheaded, falls -fever, chills, or any other sign of infection -muscle spasms, tightening, or twitches -numbness or tingling -skin blisters or bumps, or is dry, peels, or red -slow healing or unexplained pain in the mouth or jaw -unusual bleeding or bruising Side effects that usually do not require medical attention (Report these to your doctor or health care professional if they continue or are bothersome.): -muscle pain -stomach upset, gas This list may not describe all possible side effects. Call your doctor for medical advice about side effects. You may report side effects to FDA at 1-800-FDA-1088. Where should I keep my medicine? This medicine is only given in a clinic, doctor's office, or other health care setting and will not be stored at home. NOTE: This sheet is a summary. It may not cover all possible information. If you have questions about this medicine, talk to your doctor, pharmacist, or health care provider.    2016, Elsevier/Gold Standard. (2011-09-26 12:37:47)   The Prolia injection is given in the office every 6 months, but requires a blood test prior to [place] giving the shot. This is covered by Medicare (not through part D--Evista is covered by the pharmacy benefit, part D). I believe that Mickel Baas may help you figure out the cost of the Prolia, if you are leaning that way.    Please let us know what you decide.  Regardless, it is important to be sure to  get at least 1200-1500mg  of calcium every day from all sources (from food, vitamins, supplements), Vitamin D 1000 IU daily, and regular weight- bearing exercise, at least 2-3 times/week.  Also, be sure to try and take the steroid inhaler twice daily, to help with your lungs.  Remember to rinse your mouth afterwards.  I've refilled your anti-inflammatory.  If symptoms persist, we may need to go to the steroid course, vs physical therapy.   Osteoporosis Osteoporosis is the thinning and loss of density in the bones. Osteoporosis makes the bones more brittle, fragile, and likely to break (fracture). Over time, osteoporosis can cause the bones to become so weak that they fracture after a simple fall. The bones most likely to fracture are the bones in the hip, wrist, and spine. CAUSES  The exact cause is not known. RISK FACTORS Anyone can develop osteoporosis. You may be at greater risk if you have a family history of the condition or have poor nutrition. You may also have a higher risk if you are:   Female.   35 years old or older.  A smoker.  Not physically active.   White or Asian.  Slender. SIGNS AND SYMPTOMS  A fracture might be the first sign of the disease, especially if it results from a fall or injury that would not usually cause a bone to break. Other signs and symptoms include:   Low back and neck pain.  Stooped posture.  Height loss. DIAGNOSIS  To make a diagnosis, your health care provider may:  Take a medical history.  Perform a physical exam.  Order tests, such as:  A bone mineral density test.  A dual-energy X-ray absorptiometry test. TREATMENT  The goal of osteoporosis treatment is to strengthen your bones to reduce your risk of a fracture. Treatment may involve:  Making lifestyle changes, such as:  Eating a diet rich in calcium.  Doing weight-bearing and muscle-strengthening exercises.  Stopping tobacco use.  Limiting alcohol intake.  Taking  medicine to slow the process of bone loss or to increase bone density.  Monitoring your levels of calcium and vitamin D. HOME CARE INSTRUCTIONS  Include calcium and vitamin D in your diet. Calcium is important for bone health, and vitamin D helps the body absorb calcium.  Perform weight-bearing and muscle-strengthening exercises as directed by your health care provider.  Do not use any tobacco products, including cigarettes, chewing tobacco, and electronic cigarettes. If you need help quitting, ask your health care provider.  Limit your alcohol intake.  Take medicines only as directed by your health care provider.  Keep all follow-up visits as directed by your health care provider. This is important.  Take precautions at home to lower your risk of falling, such as:  Keeping rooms well lit and clutter free.  Installing safety rails on stairs.  Using rubber mats in the bathroom and other areas that are often wet or slippery. SEEK IMMEDIATE MEDICAL CARE IF:  You fall or injure yourself.    This information is not intended to replace advice given to you by your health care provider. Make sure you discuss any questions you have with your health care provider.   Document Released: 01/05/2005 Document Revised: 04/18/2014 Document Reviewed: 09/05/2013 Elsevier Interactive Patient Education [employer].

## 2015-07-09 NOTE — Progress Notes (Signed)
Chief Complaint  Patient presents with  . Advice Only    follow on DEXA, discuss medication for osteoporosis.   Patient presents to discuss her osteoporosis.  She recalls taking a medication in the past that made her sick--stomach upset, "just felt not good, ill". She recalls taking Actonel in the past, but isn't sure if that was what made her sick.  No other medications mentioned seemed familiar. She denies dysphagia or heartburn.  Previously couldn't tolerate OCP's due to headaches. She reports being prone to leg cramps.  DEXA 06/30/15 ASSESSMENT: The BMD measured at Femur Total Right is 0.612 g/cm2 with a T-score of -3.1. This patient is considered osteoporotic according to [facility]) criteria. L-3, L-4 were excluded due to degenerative changes. There has been a statistically significant decrease in BMD of Left hip since prior exam dated 05/08/2013.  She was seen last week with sciatica.  She has been taking NSAIDs and has noted some improvement, but still having pain.  In the past she had physical therapy which wasn't helpful. Required 2 injections in the past (?IM steroids?).  Hurts to sleep on her back. Pain radiates down the right leg to the ankle intermittently.  She is going to [facility] in early April, and is hoping to not be in a lot of pain while away.   COPD--she admits noncompliance with using the Flovent.  She reports having a hard time remembering to take medications.  She denies any shortness of breath.  PMH, PSH, SH reviewed  Outpatient Encounter Prescriptions as of 07/09/2015  Medication Sig Note  . aspirin 81 MG tablet Take 81 mg by mouth daily.   . cholecalciferol (VITAMIN D) 1000 units tablet Take 1,000 Units by mouth daily.   Marland Kitchen latanoprost (XALATAN) 0.005 % ophthalmic solution Place 1 drop into both eyes at bedtime.  08/06/2014: Received from: External Pharmacy  . meloxicam (MOBIC) 15 MG tablet Take 1 tablet (15 mg total) by mouth daily.   .  Multiple Vitamin (MULTIVITAMIN) capsule Take 1 capsule by mouth daily.     Marland Kitchen SPIRIVA HANDIHALER 18 MCG inhalation capsule PLACE 1 CAPSULE INTO INHALER AND INHALE THE CONTENTS DAILY   . [DISCONTINUED] meloxicam (MOBIC) 15 MG tablet Take 1 tablet (15 mg total) by mouth daily.   Marland Kitchen acetaminophen (TYLENOL) 325 MG tablet Take 650 mg by mouth every 6 (six) hours as needed. Reported on 07/09/2015   . albuterol (PROVENTIL HFA;VENTOLIN HFA) 108 (90 BASE) MCG/ACT inhaler Inhale 2 puffs into the lungs every 6 (six) hours as needed for wheezing or shortness of breath. (Patient not taking: Reported on 06/03/2015)   . fluticasone (FLOVENT HFA) 110 MCG/ACT inhaler Inhale 1 puff into the lungs 2 (two) times daily. Rinse mouth after using (Patient not taking: Reported on 07/09/2015) 07/09/2015: Forgets to take, only used it once  . ibuprofen (ADVIL,MOTRIN) 200 MG tablet Take 800 mg by mouth every 6 (six) hours as needed. Reported on 07/09/2015    No facility-administered encounter medications on file as of 07/09/2015.   Allergies  Allergen Reactions  . Codeine   . Penicillins   . Sulfa Antibiotics     Told as a child by her mother never to take  . Fish Allergy Diarrhea    Scaled fish   ROS: no fever, chills, URI symptoms, headaches, chest pain, dysphagia, bleeding, bruising, rash.  +back pain with radiculopathy. No numbness, tingling, weakness, bowel/bladder dysfunction or urinary complaints.  PHYSICAL EXAM:  BP 146/80 mmHg  Pulse 76  Ht 5\' 3"  (1.6 m)  Wt 102 lb 9.6 oz (46.539 kg)  BMI 18.18 kg/m2  Thin, pleasant female in no distress.  Mild discomfort with getting up from chair, related to back/right leg/hip discomfort, mild. Remainder of visit was spent counseling re: diagnosis of osteoporosis, risks, treatments. Also counseling re: back pain, medications.  See below  ASSESSMENT/PLAN:  Osteoporosis - discussed risks of osteoporosis, risks/side effects of potential med treatments. Will research and let us  know decision. intolerant of Actonel  Sciatica of right side - refill NSAID. Declines steroids at this time. Declines PT. May need imaging - Plan: meloxicam (MOBIC) 15 MG tablet  Chronic obstructive pulmonary disease, unspecified COPD type ([place]) - encouraged compliance with inhaled steroids in addition to spiriva   Discussed Evista and Prolia in detail, as well as bisophosphonates.  Likely it was a bisphosphonate that she didn't tolerate in the past, so will not start with that.  She is concerned about possibility of worsening leg cramps and recurrence of headaches with Evista. We discussed potential side effects and risks of Prolia and evista at length. She was given info, and will let us know her decision.  Back pain--Would like to continue NSAIDs rather than take steroids at this time, as she has received some benefit; no side effects.

## 2015-07-10 ENCOUNTER — Encounter: Payer: Self-pay | Admitting: Family Medicine

## 2015-07-13 ENCOUNTER — Other Ambulatory Visit: Payer: Medicare Other

## 2015-07-13 ENCOUNTER — Telehealth: Payer: Self-pay | Admitting: Family Medicine

## 2015-07-13 MED ORDER — TRAMADOL HCL 50 MG PO TABS: 50.0000 mg | ORAL_TABLET | Freq: Four times a day (QID) | ORAL | Status: DC | PRN

## 2015-07-13 MED ORDER — METHYLPREDNISOLONE 4 MG PO TBPK: ORAL_TABLET | ORAL | Status: DC

## 2015-07-13 NOTE — Telephone Encounter (Signed)
Please call and see if pt wants to continue with Meloxicam for the anti-inflammatory properties, but use tramadol 50mg  every 8 hrs prn severe pain in addition, vs changing to the steroid (she previously declined steroid course; would use medrol dosepak).  If chooses tramadol, okay for #30, and be sure she uses caution with driving due to possible sedation.

## 2015-07-13 NOTE — Telephone Encounter (Signed)
rx's sent and patient advised.

## 2015-07-13 NOTE — Telephone Encounter (Signed)
Pt says that Meloxicam is not helping with back pain and she wants to know if she can get something else for pain.

## 2015-07-13 NOTE — Telephone Encounter (Signed)
Morgan for the [place].  Why doesn't she also take a prescription for tramadol to have just in case.  Just do #20 of the tramadol, and 1 medrol dosepak, take as directed. She shouldn't take the meloxicam or any other NSAIDs (ibuprofen) while on the steroids.  Okay to continue tylenol for any other pain.  And tylenol can be taken along with the tramadol too, if needed (ie-she took tylenol for pain that wasn't being controlled by the steroids, but it didn't help, and an hour later she needs to take something else, it is okay to then take tramadol)

## 2015-07-13 NOTE — Telephone Encounter (Signed)
Patient prefers at this time to do the medrol dosepak vs adding tramadol to the meloxicam-states she is in such pain she thinks it is now time to do the steroid as she is planning on leaving for [place] with her kids this Friday and is in too much pain to be able to enjoy herself.

## 2015-07-15 DIAGNOSIS — H401131 Primary open-angle glaucoma, bilateral, mild stage: Secondary | ICD-10-CM | POA: Diagnosis not present

## 2015-07-15 DIAGNOSIS — H0014 Chalazion left upper eyelid: Secondary | ICD-10-CM | POA: Diagnosis not present

## 2015-07-15 DIAGNOSIS — H26492 Other secondary cataract, left eye: Secondary | ICD-10-CM | POA: Diagnosis not present

## 2015-07-29 ENCOUNTER — Other Ambulatory Visit: Payer: Self-pay | Admitting: Family Medicine

## 2015-07-29 ENCOUNTER — Ambulatory Visit
Admission: RE | Admit: 2015-07-29 | Discharge: 2015-07-29 | Disposition: A | Discharge status: Home / Self Care | Payer: Medicare Other | Source: Ambulatory Visit | Attending: Family Medicine | Admitting: Family Medicine

## 2015-07-29 DIAGNOSIS — N644 Mastodynia: Secondary | ICD-10-CM

## 2015-07-29 DIAGNOSIS — N63 Unspecified lump in breast: Secondary | ICD-10-CM | POA: Diagnosis not present

## 2015-08-05 ENCOUNTER — Ambulatory Visit (INDEPENDENT_AMBULATORY_CARE_PROVIDER_SITE_OTHER): Payer: Medicare Other | Admitting: Family Medicine

## 2015-08-05 ENCOUNTER — Ambulatory Visit
Admission: RE | Admit: 2015-08-05 | Discharge: 2015-08-05 | Disposition: A | Discharge status: Home / Self Care | Payer: Medicare Other | Source: Ambulatory Visit | Attending: Family Medicine | Admitting: Family Medicine

## 2015-08-05 ENCOUNTER — Encounter: Payer: Self-pay | Admitting: Family Medicine

## 2015-08-05 VITALS — BP 138/82 | HR 72 | Temp 97.5°F | Wt 101.4 lb

## 2015-08-05 DIAGNOSIS — M5431 Sciatica, right side: Secondary | ICD-10-CM

## 2015-08-05 DIAGNOSIS — M47816 Spondylosis without myelopathy or radiculopathy, lumbar region: Secondary | ICD-10-CM | POA: Diagnosis not present

## 2015-08-05 NOTE — Progress Notes (Signed)
   Subjective:    Patient ID: Kelsey Fuller, female    DOB: July 27, 1940, 75 y.o.   MRN: [id number]  HPI Chief Complaint  Patient presents with  . follow-up    follow-up for continued back pain   She is a 75 year old caucasian female with a history of osteoporosis who is here for continued right sciatica that is unchanged for approximately 1 month. States pain is mainly in her right buttock and shoots down her right leg to her ankle. Pain is worse with walking, bending and somewhat improved with laying on her side or sitting. She is taking daily meloxicam and Tylenol arthritis. She states she was taking prednisone for the pain and completed it about a week and a half ago. States she got no relief from the steroids. Reports taking Tramadol in the past but no relief and she did not like how it made her feel.   She has seen Dr Tomi Bamberger on 2 occasions for this issue.  She has history of injection x 2 into ?buttock at [facility]. She states she went to PT approximately 3 years ago for sciatica on her left side.  States she cannot lie flat on her back due to severe pain.  No loss of control of bowels or bladder.  Denies fever, chills, unexplained weight loss, cough, numbness, tingling, weakness.   Past Medical History  Diagnosis Date  . Chronic headaches   . Asthma     (pt denies)  . Arthritis   . Shortness of breath on exertion   . Leg pain   . COPD (chronic obstructive pulmonary disease) ([place])   . Osteoporosis   . Glaucoma   . Thrombophlebitis     Reviewed allergies, medications, past medical, and social history.  Review of Systems Pertinent positives and negatives in the history of present illness.     Objective:   Physical Exam  Constitutional: She appears well-developed and well-nourished. No distress.  Musculoskeletal:       Lumbar back: Normal.  No erythema, rash, bruising to right hip, lumbar or buttock. No edema or spasm. Tenderness over right SI notch and buttock, unable to  reproduce pain down leg. Normal sensation, pulse, ROM and strength. DTRs symmetric and normal.  Negative straight leg test.    BP 138/82 mmHg  Pulse 72  Temp(Src) 97.5 F (36.4 C) (Oral)  Wt 101 lb 6.4 oz (45.995 kg)      Assessment & Plan:  Sciatica of right side - Plan: DG Lumbar Spine Complete  Reviewed previous visits regarding right sciatica and recommend lumbar x-ray. Pending XR result, I am also recommending physical therapy and patient agrees to this plan. Continue taking Nsaids.  Will follow up pending XR. She will need to follow up with Dr. Tomi Bamberger if PT not helping or if the pain worsens.

## 2015-08-06 ENCOUNTER — Other Ambulatory Visit: Payer: Self-pay | Admitting: Family Medicine

## 2015-08-06 ENCOUNTER — Ambulatory Visit
Admission: RE | Admit: 2015-08-06 | Discharge: 2015-08-06 | Disposition: A | Discharge status: Home / Self Care | Payer: Medicare Other | Source: Ambulatory Visit | Attending: Family Medicine | Admitting: Family Medicine

## 2015-08-06 DIAGNOSIS — N644 Mastodynia: Secondary | ICD-10-CM

## 2015-08-06 DIAGNOSIS — N63 Unspecified lump in breast: Secondary | ICD-10-CM | POA: Diagnosis not present

## 2015-08-06 DIAGNOSIS — D0511 Intraductal carcinoma in situ of right breast: Secondary | ICD-10-CM | POA: Diagnosis not present

## 2015-08-07 ENCOUNTER — Encounter: Payer: Self-pay | Admitting: *Deleted

## 2015-08-07 ENCOUNTER — Telehealth: Payer: Self-pay | Admitting: *Deleted

## 2015-08-07 DIAGNOSIS — C50211 Malignant neoplasm of upper-inner quadrant of right female breast: Secondary | ICD-10-CM | POA: Insufficient documentation

## 2015-08-07 HISTORY — DX: Malignant neoplasm of upper-inner quadrant of right female breast: C50.211

## 2015-08-07 NOTE — Telephone Encounter (Signed)
Confirmed [facility] for 08/12/15 at 1215 .  Instructions and contact information given.

## 2015-08-07 NOTE — Telephone Encounter (Signed)
Mailed clinic packet to pt.  

## 2015-08-10 DIAGNOSIS — H26492 Other secondary cataract, left eye: Secondary | ICD-10-CM | POA: Diagnosis not present

## 2015-08-11 ENCOUNTER — Other Ambulatory Visit: Payer: Self-pay | Admitting: General Surgery

## 2015-08-11 DIAGNOSIS — C50211 Malignant neoplasm of upper-inner quadrant of right female breast: Secondary | ICD-10-CM

## 2015-08-12 ENCOUNTER — Ambulatory Visit
Admission: RE | Admit: 2015-08-12 | Discharge: 2015-08-12 | Disposition: A | Discharge status: Home / Self Care | Payer: Medicare Other | Source: Ambulatory Visit | Attending: Radiation Oncology | Admitting: Radiation Oncology

## 2015-08-12 ENCOUNTER — Ambulatory Visit: Payer: Medicare Other | Admitting: Physical Therapy

## 2015-08-12 ENCOUNTER — Encounter: Payer: Self-pay | Admitting: Hematology and Oncology

## 2015-08-12 ENCOUNTER — Other Ambulatory Visit (HOSPITAL_BASED_OUTPATIENT_CLINIC_OR_DEPARTMENT_OTHER): Payer: Medicare Other

## 2015-08-12 ENCOUNTER — Ambulatory Visit (HOSPITAL_BASED_OUTPATIENT_CLINIC_OR_DEPARTMENT_OTHER): Payer: Medicare Other | Admitting: Hematology and Oncology

## 2015-08-12 VITALS — BP 155/93 | HR 102 | Temp 97.6°F | Resp 18 | Ht 63.0 in | Wt 98.2 lb

## 2015-08-12 DIAGNOSIS — Z170 Estrogen receptor positive status [ER+]: Secondary | ICD-10-CM

## 2015-08-12 DIAGNOSIS — M5431 Sciatica, right side: Secondary | ICD-10-CM | POA: Diagnosis not present

## 2015-08-12 DIAGNOSIS — C50211 Malignant neoplasm of upper-inner quadrant of right female breast: Secondary | ICD-10-CM

## 2015-08-12 DIAGNOSIS — M810 Age-related osteoporosis without current pathological fracture: Secondary | ICD-10-CM | POA: Diagnosis not present

## 2015-08-12 DIAGNOSIS — J449 Chronic obstructive pulmonary disease, unspecified: Secondary | ICD-10-CM

## 2015-08-12 LAB — COMPREHENSIVE METABOLIC PANEL
ALBUMIN: 4.1 g/dL (ref 3.5–5.0)
ALK PHOS: 92 U/L (ref 40–150)
ALT: 16 U/L (ref 0–55)
ANION GAP: 9 meq/L (ref 3–11)
AST: 18 U/L (ref 5–34)
BILIRUBIN TOTAL: 0.9 mg/dL (ref 0.20–1.20)
BUN: 16.1 mg/dL (ref 7.0–26.0)
CALCIUM: 9.8 mg/dL (ref 8.4–10.4)
CO2: 26 mEq/L (ref 22–29)
Chloride: 106 mEq/L (ref 98–109)
Creatinine: 0.8 mg/dL (ref 0.6–1.1)
EGFR: 68 mL/min/{1.73_m2} — AB (ref >90)
Glucose: 94 mg/dl (ref 70–140)
Potassium: 4 mEq/L (ref 3.5–5.1)
Sodium: 141 mEq/L (ref 136–145)
TOTAL PROTEIN: 7.7 g/dL (ref 6.4–8.3)

## 2015-08-12 LAB — CBC WITH DIFFERENTIAL/PLATELET
BASO%: 0.4 % (ref 0.0–2.0)
BASOS ABS: 0 10*3/uL (ref 0.0–0.1)
EOS ABS: 0.2 10*3/uL (ref 0.0–0.5)
EOS%: 1.9 % (ref 0.0–7.0)
HEMATOCRIT: 45.5 % (ref 34.8–46.6)
HEMOGLOBIN: 15.4 g/dL (ref 11.6–15.9)
LYMPH%: 27.3 % (ref 14.0–49.7)
MCH: 29.7 pg (ref 25.1–34.0)
MCHC: 33.8 g/dL (ref 31.5–36.0)
MCV: 87.7 fL (ref 79.5–101.0)
MONO#: 1.1 10*3/uL — AB (ref 0.1–0.9)
MONO%: 13.1 % (ref 0.0–14.0)
NEUT%: 57.3 % (ref 38.4–76.8)
NEUTROS ABS: 4.9 10*3/uL (ref 1.5–6.5)
PLATELETS: 240 10*3/uL (ref 145–400)
RBC: 5.19 10*6/uL (ref 3.70–5.45)
RDW: 14 % (ref 11.2–14.5)
WBC: 8.5 10*3/uL (ref 3.9–10.3)
lymph#: 2.3 10*3/uL (ref 0.9–3.3)

## 2015-08-12 NOTE — Progress Notes (Signed)
[place] [facility] NOTE  Patient Care Team: Rita Ohara, MD as PCP - General (Family Medicine)  CHIEF COMPLAINTS/PURPOSE OF CONSULTATION:  Newly diagnosed breast cancer  HISTORY OF PRESENTING ILLNESS:  Kelsey Fuller 75 y.o. female is here because of recent diagnosis of left breast cancer. Patient has a history of benign left breast biopsy in the 70s. She came in complaining of pain in the right breast and underwent a mammogram that showed asymmetry posteriorly. Ultrasound was performed which revealed a 4 x 4 by 3 mm area of abnormality. Axilla was negative. 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 She was presented this morning in the multidisciplinary tumor board and she is here today to discuss a treatment plan.   I reviewed her records extensively and collaborated the history with the patient.  SUMMARY OF ONCOLOGIC HISTORY:   Breast cancer of upper-inner quadrant of right female breast ([place])   08/06/2015 Mammogram Right breast mammogram and ultrasound revealed asymmetry posteriorly 4 x 4 x 3 mm, axilla negative,Tis N0 stage 0   08/06/2015 Initial Diagnosis Right breast biopsy 2:00 position goal DCIS with necrosis intermediate grade,ER 100%, PR 85%,?foci of early stromal invasion    In terms of breast cancer risk profile:  She menarched at early age of 65 and went to menopause at age 54  She had 3 pregnancy, her first child was born at age 34  She has not received birth control pills.  She was never exposed to fertility medications or hormone replacement therapy.  She has no family history of Breast/GYN/GI cancer  MEDICAL HISTORY:  Past Medical History  Diagnosis Date  . Chronic headaches   . Asthma     (pt denies)  . Arthritis   . Shortness of breath on exertion   . Leg pain   . COPD (chronic obstructive pulmonary disease) ([place])   . Osteoporosis   . Glaucoma   . Thrombophlebitis   . Breast cancer of  upper-inner quadrant of right female breast ([place]) 08/07/2015  . Breast cancer [facility])     SURGICAL HISTORY: Past Surgical History  Procedure Laterality Date  . Breast surgery    . Varicose vein surgery    . Appendectomy    . Tonsillectomy      SOCIAL HISTORY: Social History   Social History  . Marital Status: Married    Spouse Name: N/A  . Number of Children: N/A  . Years of Education: N/A   Occupational History  . Not on file.   Social History Main Topics  . Smoking status: Former Smoker -- 0.50 packs/day for 20 years    Types: Cigarettes    Quit date: 04/12/1975  . Smokeless tobacco: Never Used  . Alcohol Use: 0.6 oz/week    1 Standard drinks or equivalent per week     Comment: occasional glass of wine, usually on Sundays when cooking  . Drug Use: No  . Sexual Activity: Not Currently   Other Topics Concern  . Not on file   Social History Narrative   Lives alone.  Widowed in 07/2014.  3 sons, 3 grandchildren, all locally. Originally from [place], [place] ([place]). Retired Psychiatric nurse. Volunteers at [employer]    FAMILY HISTORY: Family History  Problem Relation Age of Onset  . Diabetes Mother   . Heart disease Mother     congestive heart failure  . Hypertension Mother   . Dementia Mother   . Heart disease Father  rheumatic heart disease  . Cancer Neg Hx     ALLERGIES:  is allergic to codeine; penicillins; sulfa antibiotics; and fish allergy.  MEDICATIONS:  Current Outpatient Prescriptions  Medication Sig Dispense Refill  . acetaminophen (TYLENOL) 325 MG tablet Take 650 mg by mouth every 6 (six) hours as needed. Reported on 07/09/2015    . aspirin 81 MG tablet Take 81 mg by mouth daily.    Marland Kitchen latanoprost (XALATAN) 0.005 % ophthalmic solution Place 1 drop into both eyes at bedtime.   3  . meloxicam (MOBIC) 15 MG tablet Take 1 tablet (15 mg total) by mouth daily. 30 tablet 0  . SPIRIVA HANDIHALER 18 MCG inhalation capsule PLACE 1 CAPSULE INTO INHALER AND INHALE THE  CONTENTS DAILY 90 capsule 0   No current facility-administered medications for this visit.    REVIEW OF SYSTEMS:   Constitutional: Denies fevers, chills or abnormal night sweats Eyes: Denies blurriness of vision, double vision or watery eyes Ears, nose, mouth, throat, and face: Denies mucositis or sore throat Respiratory: Denies cough, dyspnea or wheezes Cardiovascular: Denies palpitation, chest discomfort or lower extremity swelling Gastrointestinal:  Denies nausea, heartburn or change in bowel habits Skin: Denies abnormal skin rashes Lymphatics: Denies new lymphadenopathy or easy bruising Neurological:Denies numbness, tingling or new weaknesses Behavioral/Psych: Mood is stable, no new changes  Breast:  Denies any palpable lumps or discharge All other systems were reviewed with the patient and are negative.  PHYSICAL EXAMINATION: ECOG PERFORMANCE STATUS: 0 - Asymptomatic  Filed Vitals:   08/12/15 1254  BP: 155/93  Pulse: 102  Temp: 97.6 F (36.4 C)  Resp: 18   Filed Weights   08/12/15 1254  Weight: 98 lb 3.2 oz (44.543 kg)    GENERAL:alert, no distress and comfortable SKIN: skin color, texture, turgor are normal, no rashes or significant lesions EYES: normal, conjunctiva are pink and non-injected, sclera clear OROPHARYNX:no exudate, no erythema and lips, buccal mucosa, and tongue normal  NECK: supple, thyroid normal size, non-tender, without nodularity LYMPH:  no palpable lymphadenopathy in the cervical, axillary or inguinal LUNGS: clear to auscultation and percussion with normal breathing effort HEART: regular rate & rhythm and no murmurs and no lower extremity edema ABDOMEN:abdomen soft, non-tender and normal bowel sounds Musculoskeletal:no cyanosis of digits and no clubbing  PSYCH: alert & oriented x 3 with fluent speech NEURO: no focal motor/sensory deficits BREAST: No palpable nodules in breast. No palpable axillary or supraclavicular lymphadenopathy (exam  performed in the presence of a chaperone)   LABORATORY DATA:  I have reviewed the data as listed Lab Results  Component Value Date   WBC 8.5 08/12/2015   HGB 15.4 08/12/2015   HCT 45.5 08/12/2015   MCV 87.7 08/12/2015   PLT 240 08/12/2015   Lab Results  Component Value Date   NA 141 08/12/2015   K 4.0 08/12/2015   CL 104 10/29/2014   CO2 26 08/12/2015    RADIOGRAPHIC STUDIES: I have personally reviewed the radiological reports and agreed with the findings in the report.  ASSESSMENT AND PLAN:  Breast cancer of upper-inner quadrant of right female breast ([place]) Right breast biopsy 08/06/2015:2:00 position goal DCIS with necrosis intermediate grade,ER 100%, PR 85%,?foci of early stromal invasion. Right breast mammogram and ultrasound revealed asymmetry posteriorly 4 x 4 x 3 mm, axilla negative, Tis N0 stage 0  Pathology review: I discussed with the patient the difference between DCIS and invasive breast cancer. It is considered a precancerous lesion. DCIS is classified as a 0.  It is generally detected through mammograms as calcifications. We discussed the significance of grades and its impact on prognosis. We also discussed the importance of ER and PR receptors and their implications to adjuvant treatment options. Prognosis of DCIS dependence on grade, comedo necrosis. It is anticipated that if not treated, 20-30% of DCIS can develop into invasive breast cancer.  Recommendation: 1. Breast conserving surgery 2. Followed by adjuvant radiation therapy 3. Followed by antiestrogen therapy with tamoxifen 5 years  I discussed with the patient that we might consider either radiation or antiestrogen therapy. After the surgery we can discuss the risks and benefits of both these approaches. She would be willing to consider doing the radiation and that if she tolerates tamoxifen and then she can continue with the tamoxifen however she cannot tolerated then she can discontinue it at that  time.  Tamoxifen counseling: We discussed the risks and benefits of tamoxifen. These include but not limited to insomnia, hot flashes, mood changes, vaginal dryness, and weight gain. Although rare, serious side effects including endometrial cancer, risk of blood clots were also discussed. Patient understands these risks and consented to starting treatment. Planned treatment duration is 5 years.  Return to clinic after surgery to discuss the final pathology report and come up with an adjuvant treatment plan.   All questions were answered. The patient knows to call the clinic with any problems, questions or concerns.    Rulon Eisenmenger, MD 08/12/2015

## 2015-08-12 NOTE — Progress Notes (Signed)
Note created by Dr. Gudena during office visit, copy to patient,original to scan. 

## 2015-08-12 NOTE — Assessment & Plan Note (Signed)
Right breast biopsy 08/06/2015:2:00 position goal DCIS with necrosis intermediate grade,ER 100%, PR 85%,?foci of early stromal invasion. Right breast mammogram and ultrasound revealed asymmetry posteriorly 4 x 4 x 3 mm, axilla negative, Tis N0 stage 0  Pathology review: I discussed with the patient the difference between DCIS and invasive breast cancer. It is considered a precancerous lesion. DCIS is classified as a 0. It is generally detected through mammograms as calcifications. We discussed the significance of grades and its impact on prognosis. We also discussed the importance of ER and PR receptors and their implications to adjuvant treatment options. Prognosis of DCIS dependence on grade, comedo necrosis. It is anticipated that if not treated, 20-30% of DCIS can develop into invasive breast cancer.  Recommendation: 1. Breast conserving surgery 2. Followed by adjuvant radiation therapy 3. Followed by antiestrogen therapy with tamoxifen 5 years  Tamoxifen counseling: We discussed the risks and benefits of tamoxifen. These include but not limited to insomnia, hot flashes, mood changes, vaginal dryness, and weight gain. Although rare, serious side effects including endometrial cancer, risk of blood clots were also discussed. We strongly believe that the benefits far outweigh the risks. Patient understands these risks and consented to starting treatment. Planned treatment duration is 5 years.  Return to clinic after surgery to discuss the final pathology report and come up with an adjuvant treatment plan.

## 2015-08-12 NOTE — Progress Notes (Signed)
Radiation Oncology         ([phone]) [phone] ________________________________  Name: Kelsey Fuller MRN: [id number]  Date: 08/12/2015  DOB: 03-15-1941  SN:KNLZJ,QBH A, MD  Stark Klein, MD     REFERRING PHYSICIAN: Stark Klein, MD   DIAGNOSIS: The encounter diagnosis was Breast cancer of upper-inner quadrant of right female breast ([place]).   HISTORY OF PRESENT ILLNESS: Kelsey Fuller is a 75 y.o. female seen after complaining of breast pain. She had a mammogram on 07/29/15 that confirmed an asymmetry in the right breast.  She had an ultrasound that revealed a 73m lesion in the right breast. The axilla did not reveal adenopathy. She had a biopsy on 08/06/15 reveals a grade 2-3 DCIS, ER/PR positive.  [facility] and to meet with Dr. MLisbeth Renshawregarding the role of radiotherapy.    PREVIOUS RADIATION THERAPY: No   PAST MEDICAL HISTORY:  Past Medical History  Diagnosis Date  . Chronic headaches   . Asthma     (pt denies)  . Arthritis   . Shortness of breath on exertion   . Leg pain   . [place]   . Osteoporosis   . Glaucoma   . Thrombophlebitis   . Breast cancer of upper-inner quadrant of right [place] breast [place] 08/07/2015       PAST SURGICAL HISTORY: Past Surgical History  Procedure Laterality Date  . Breast surgery    . Varicose vein surgery    . Appendectomy    . Tonsillectomy       FAMILY HISTORY:  Family History  Problem Relation Age of Onset  . Diabetes Mother   . Heart disease Mother     congestive heart failure  . Hypertension Mother   . Dementia Mother   . Heart disease Father     rheumatic heart disease  . Cancer Neg Hx      SOCIAL HISTORY:  reports that she quit smoking about 40 years ago. Her smoking use included Cigarettes. She has a 10 pack-year smoking history. She has never used smokeless tobacco. She reports that she drinks about 0.6 oz of alcohol per week.  She reports that she does not use illicit drugs. 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 acetaminophen (TYLENOL) 325 MG tablet Take 650 mg by mouth every 6 (six) hours as needed. Reported on 07/09/2015    . albuterol (PROVENTIL HFA;VENTOLIN HFA) 108 (90 BASE) MCG/ACT inhaler Inhale 2 puffs into the lungs every 6 (six) hours as needed for wheezing or shortness of breath. 18 g 1  . aspirin 81 MG tablet Take 81 mg by mouth daily.    . cephALEXin (KEFLEX) 500 MG capsule Take 500 mg by mouth 4 (four) times daily.    . cholecalciferol (VITAMIN D) 1000 units tablet Take 1,000 Units by mouth daily.    . fluticasone (FLOVENT HFA) 110 MCG/ACT inhaler Inhale 1 puff into the lungs 2 (two) times daily. Rinse mouth after using 1 Inhaler 5  . ibuprofen (ADVIL,MOTRIN) 200 MG tablet Take 800 mg by mouth every 6 (six) hours as needed. Reported on 07/09/2015    . latanoprost (XALATAN) 0.005 % ophthalmic solution Place 1 drop into both eyes at bedtime.  3  . meloxicam (MOBIC) 15 MG tablet Take 1 tablet (15 mg total) by mouth daily. 30 tablet 0  . Multiple Vitamin (MULTIVITAMIN) capsule Take 1 capsule by mouth daily.      Marland Kitchen SPIRIVA HANDIHALER 18 MCG inhalation capsule PLACE 1 CAPSULE INTO INHALER AND INHALE THE CONTENTS DAILY 90 capsule 0   No current facility-administered medications for this encounter.     REVIEW OF SYSTEMS: On review of systems, the patient reports that she is doing well overall. She denies any chest pain, shortness of breath, cough, fevers, chills, night sweats, unintended weight changes. She denies any bowel or bladder disturbances, and denies abdominal pain, nausea or vomiting. She denies any new musculoskeletal or joint aches or pains. A complete review of  systems is obtained and is otherwise negative.     PHYSICAL EXAM:   Pain scale 0/10 In general this is a well appearing Caucasian female in no acute distress. She is alert and oriented x4 and appropriate throughout the examination. HEENT reveals that the patient is normocephalic, atraumatic. EOMs are intact. PERRLA. Skin is intact without any evidence of gross lesions. Cardiovascular exam reveals a regular rate and rhythm, no clicks rubs or murmurs are auscultated. Chest is clear to auscultation bilaterally. Lymphatic assessment is performed and does not reveal any adenopathy in the cervical, supraclavicular, axillary, or inguinal chains. Bilateral breasts are examined and the right breast reveals minimal ecchymosis of the biopsy site. No palpable abnormalities are seen. No nipple bleeding or discharge is noted bilaterally. Abdomen has active bowel sounds in all quadrants and is intact. The abdomen is soft, non tender, non distended. Lower extremities are negative for pretibial pitting edema, deep calf tenderness, cyanosis or clubbing.   ECOG = 0  0 - Asymptomatic (Fully active, able to carry on all predisease activities without restriction)  1 - Symptomatic but completely ambulatory (Restricted in physically strenuous activity but ambulatory and able to carry out work of a light or sedentary nature. For example, light housework, office work)  2 - Symptomatic, <50% in bed during the day (Ambulatory and capable of all self care but unable to carry out any work activities. Up and about more than 50% of waking hours)  3 - Symptomatic, >50% in bed, but not bedbound (Capable of only limited self-care, confined to bed or chair 50% or more of waking hours)  4 - Bedbound (Completely disabled. Cannot carry on any self-care. Totally confined to bed or chair)  5 - Death   Eustace Pen MM, Creech RH, Tormey DC, et al. [phone]). "Toxicity and response criteria of the [facility]". [place] Oncol. 5 (6): 649-55    LABORATORY DATA:  Lab Results  Component Value Date   WBC 6.8 10/29/2014   HGB 14.4 10/29/2014   HCT 43.9 10/29/2014   MCV 86.2 10/29/2014   PLT 237 10/29/2014   Lab Results  Component Value Date   NA 140 10/29/2014   K 4.1 10/29/2014   CL 104 10/29/2014   CO2 26 10/29/2014   Lab Results  Component Value Date   ALT 10 10/29/2014   AST 14 10/29/2014   ALKPHOS 80 10/29/2014   BILITOT 0.7 10/29/2014      RADIOGRAPHY: Dg Lumbar Spine Complete  08/05/2015  CLINICAL DATA:  Back pain with radiation into buttocks. No known injury. EXAM: LUMBAR SPINE - COMPLETE 4+ VIEW COMPARISON:  No recent prior. FINDINGS: Calcific density noted projected over the right kidney suggesting right nephrolithiasis. Air-filled loops  of small and large bowel noted. Mild adynamic ileus cannot be excluded. Diffuse osteopenia and degenerative change. 2 mm anterolisthesis L3 on L4 and L4 on L5. No acute bony abnormality identified. IMPRESSION: 1. Diffuse osteopenia degenerative change. 2 mm anterolisthesis L3 on L4 and L4 on L5.No acute bony abnormality identified. No evidence of fracture. 2.  Mild adynamic ileus cannot be excluded. 3.  Right nephrolithiasis cannot be excluded. 4. Aortoiliac atherosclerotic vascular disease. Electronically Signed   By: Marcello Moores  Register   On: 08/05/2015 13:00   Mm Digital Diagnostic Unilat R  08/06/2015  CLINICAL DATA:  Post ultrasound-guided biopsy of the mass in the right breast at the 2 o'clock location. EXAM: DIAGNOSTIC RIGHT MAMMOGRAM POST ULTRASOUND BIOPSY COMPARISON:  Previous exam(s). FINDINGS: Mammographic images were obtained following ultrasound guided biopsy of a mass in the right breast at the 2 o'clock position. A ribbon shaped biopsy marking clip is present an the located along the anterior margin of the biopsied mass. IMPRESSION: Ribbon shaped biopsy marking clip along the anterior margin of the biopsied mass in the right breast at the 2  o'clock location. Final Assessment: Post Procedure Mammograms for Marker Placement Electronically Signed   By: Everlean Alstrom M.D.   On: 08/06/2015 15:18   US Breast Ltd Uni Right Inc Axilla  07/29/2015  CLINICAL DATA:  75 year old female with pain in the medial right breast. EXAM: 2D DIGITAL DIAGNOSTIC BILATERAL MAMMOGRAM WITH CAD AND ADJUNCT TOMO ULTRASOUND RIGHT BREAST COMPARISON:  Previous exam(s). ACR Breast Density Category b: There are scattered areas of fibroglandular density. FINDINGS: Within the medial right breast, there is an irregular 5 mm mass, posterior depth. No additional abnormality is identified within either breast. Mammographic images were processed with CAD. On physical exam, no discrete mass is felt in the area of concern within the medial right breast. Targeted ultrasound is performed, showing an irregular, hypoechoic mass at 2 o'clock, 6 cm from the nipple measuring 4 x 4 x 3 mm, which is thought to correspond to the mammographic finding. Targeted ultrasound the right axilla demonstrates no suspicious appearing axillary lymph nodes. IMPRESSION: Indeterminate right breast mass at 2 o'clock. RECOMMENDATION: Ultrasound-guided right breast biopsy. I have discussed the findings and recommendations with the patient. Results were also provided in writing at the conclusion of the visit. If applicable, a reminder letter will be sent to the patient regarding the next appointment. BI-RADS CATEGORY  4: Suspicious. Electronically Signed   By: Pamelia Hoit M.D.   On: 07/29/2015 11:45   Mm Diag Breast Tomo Bilateral  07/29/2015  CLINICAL DATA:  75 year old female with pain in the medial right breast. EXAM: 2D DIGITAL DIAGNOSTIC BILATERAL MAMMOGRAM WITH CAD AND ADJUNCT TOMO ULTRASOUND RIGHT BREAST COMPARISON:  Previous exam(s). ACR Breast Density Category b: There are scattered areas of fibroglandular density. FINDINGS: Within the medial right breast, there is an irregular 5 mm mass, posterior depth.  No additional abnormality is identified within either breast. Mammographic images were processed with CAD. On physical exam, no discrete mass is felt in the area of concern within the medial right breast. Targeted ultrasound is performed, showing an irregular, hypoechoic mass at 2 o'clock, 6 cm from the nipple measuring 4 x 4 x 3 mm, which is thought to correspond to the mammographic finding. Targeted ultrasound the right axilla demonstrates no suspicious appearing axillary lymph nodes. IMPRESSION: Indeterminate right breast mass at 2 o'clock. RECOMMENDATION: Ultrasound-guided right breast biopsy. I have discussed the findings and recommendations with the patient. Results were also  provided in writing at the conclusion of the visit. If applicable, a reminder letter will be sent to the patient regarding the next appointment. BI-RADS CATEGORY  4: Suspicious. Electronically Signed   By: Pamelia Hoit M.D.   On: 07/29/2015 11:45   [place] Rt Breast Bx W Loc Dev 1st Lesion Img Bx Spec US Guide  08/07/2015  ADDENDUM REPORT: 08/07/2015 11:01 ADDENDUM: Pathology revealed INTERMEDIATE GRADE DUCTAL CARCINOMA IN SITU WITH NECROSIS of the Right breast at the 2:00 o'clock location. This was found to be concordant by Dr. Everlean Alstrom. Pathology results were discussed with the patient by telephone. The patient reported doing well after the biopsy. Post biopsy instructions and care were reviewed and questions were answered. The patient was encouraged to call [facility] for any additional concerns. The patient was referred to [facility] on Aug 12, 2015. Pathology results reported by Terie Purser, RN on 08/07/2015. Electronically Signed   By: Everlean Alstrom M.D.   On: 08/07/2015 11:01  08/07/2015  CLINICAL DATA:  75 year old female with an indeterminate right breast mass. EXAM: ULTRASOUND GUIDED RIGHT BREAST CORE NEEDLE BIOPSY  COMPARISON:  Previous exam(s). PROCEDURE: I met with the patient and we discussed the procedure of ultrasound-guided biopsy, including benefits and alternatives. We discussed the high likelihood of a successful procedure. We discussed the risks of the procedure including infection, bleeding, tissue injury, clip migration, and inadequate sampling. Informed written consent was given. The usual time-out protocol was performed immediately prior to the procedure. Using sterile technique and 1% Lidocaine as local anesthetic, under direct ultrasound visualization, a 12 gauge vacuum-assisted device was used to perform biopsy of the mass in the right breast at the 2 o'clock locationusing a lateral to medial approach. At the conclusion of the procedure, a ribbon shaped tissue marker clip was deployed into the biopsy cavity. Follow-up 2-view mammogram was performed and dictated separately. IMPRESSION: Ultrasound-guided biopsy of the mass in the right breast at the 2 o'clock position. No apparent complications. Electronically Signed: By: Everlean Alstrom M.D. On: 08/06/2015 15:02       IMPRESSION: ER/PR position DCIS of the right breast   PLAN: Dr. Lisbeth Renshaw discusses the pathology findings and reviews the nature of DCIS. The consensus from the breast conference included lumpectomy with consideration of both radiotherapy and/or antiestrogen therapy.  After meeting with the patient face to face she appears to be clinically in good health and eligible for radiotherapy. Dr. Lindi Adie agrees that radiotherapy would be preferred and that he does not see additional value in adding antiestrogen therapy. Dr. Lisbeth Renshaw recommends proceeding with hypofractionated radiation given in 20 fractions over 4 weeks. We discussed the risks, benefits, short, and long term effects of the treatment. She is interested in moving forward with this.   The above documentation reflects my direct findings during this shared patient visit. Please see the  separate note by Dr. Lisbeth Renshaw on this date for the remainder of the patient's plan of care.    Carola Rhine, PAC

## 2015-08-14 ENCOUNTER — Other Ambulatory Visit: Payer: Self-pay | Admitting: General Surgery

## 2015-08-14 DIAGNOSIS — M5431 Sciatica, right side: Secondary | ICD-10-CM | POA: Diagnosis not present

## 2015-08-14 DIAGNOSIS — C50211 Malignant neoplasm of upper-inner quadrant of right female breast: Secondary | ICD-10-CM

## 2015-08-17 ENCOUNTER — Encounter: Payer: Self-pay | Admitting: Radiation Oncology

## 2015-08-17 ENCOUNTER — Telehealth: Payer: Self-pay | Admitting: *Deleted

## 2015-08-17 DIAGNOSIS — M5431 Sciatica, right side: Secondary | ICD-10-CM | POA: Diagnosis not present

## 2015-08-17 NOTE — Telephone Encounter (Signed)
Spoke to pt concerning [place] from 08/12/15. Denies questions or concerns regarding dx or treatment care plan. Encourage pt to call with needs. Received verbal understanding. Contact information provided.

## 2015-08-18 ENCOUNTER — Telehealth: Payer: Self-pay | Admitting: Hematology and Oncology

## 2015-08-18 NOTE — Telephone Encounter (Signed)
left msg for post op f/u

## 2015-08-19 DIAGNOSIS — M5431 Sciatica, right side: Secondary | ICD-10-CM | POA: Diagnosis not present

## 2015-08-20 ENCOUNTER — Encounter: Payer: Self-pay | Admitting: Family Medicine

## 2015-08-20 ENCOUNTER — Ambulatory Visit (INDEPENDENT_AMBULATORY_CARE_PROVIDER_SITE_OTHER): Payer: Medicare Other | Admitting: Family Medicine

## 2015-08-20 VITALS — BP 126/74 | HR 80 | Temp 99.3°F | Ht 63.0 in | Wt 100.0 lb

## 2015-08-20 DIAGNOSIS — J019 Acute sinusitis, unspecified: Secondary | ICD-10-CM | POA: Diagnosis not present

## 2015-08-20 MED ORDER — AZITHROMYCIN 250 MG PO TABS: ORAL_TABLET | ORAL | Status: DC

## 2015-08-20 NOTE — Patient Instructions (Signed)
  Drink plenty of fluids. Continue the alka selzer cold plus medication, since it has been helpful.  Reviewing the ingredients, it appears that it contains decontestants, antihistamine and a cough suppressant.  Don't use other cough syrups along with this.  You could use a plain guaifenesin (expectorant--plain robitussin or mucinex, but no combination medications).   Try use cough drops/lozenges to help with the cough. Call us for a prescription of tessalon if your cough isn't improving.    Return if you have persistent fevers, worsening cough, shortness of breath or other new symptoms.  Remember that it takes a full 10 days for the zpak to finish.  Call on day 11 if you aren't 100% better, for a refill.  If you are not ANY better, or worse by 5-7 days, please call for a change in antibiotics.

## 2015-08-20 NOTE — Progress Notes (Signed)
Chief Complaint  Patient presents with  . Cough    started about a week ago with sinus pain and pressure. Blowing out green and bloosy mucus. When she coughs she said it really hurts under her breast area. Has not any fevers that she knows of but some chills from time to time. Ears feel full. Having surgery on 5/22 /17.   1 week ago she started with head congestion/stuffiness, ear plugging.  +sinus pressure on both sides of her nose, and frontal headaches.  Nasal mucus is green and bloody.  She is coughing--nonproductive, but ribs are sore from coughing.  No shortness of breath. Intermittent sore throat, dry.  She feels fatigued.  Has not seen any improvement recently, if anything feels worse.  No sick contacts. No fevers, but has had some chills. Using alka selzer plus cold, which helps some, mucinex (6 hr multi-ingredient version; made her feel worse, so stopped)  Recently diagnosed with breast cancer.  She is scheduled for breast lumpectomy, radiation later this month.  PMH, PSH, SH reviewed  Outpatient Encounter Prescriptions as of 08/20/2015  Medication Sig Note  . aspirin 81 MG tablet Take 81 mg by mouth daily.   . Chlorphen-Phenyleph-ASA (ALKA-SELTZER PLUS COLD PO) Take 2 capsules by mouth every 4 (four) hours as needed.   . latanoprost (XALATAN) 0.005 % ophthalmic solution Place 1 drop into both eyes at bedtime.  08/06/2014: Received from: External Pharmacy  . Multiple Vitamins-Minerals (ALIVE WOMENS 50+ PO) Take 1 tablet by mouth daily.   Marland Kitchen SPIRIVA HANDIHALER 18 MCG inhalation capsule PLACE 1 CAPSULE INTO INHALER AND INHALE THE CONTENTS DAILY   . acetaminophen (TYLENOL) 325 MG tablet Take 650 mg by mouth every 6 (six) hours as needed. Reported on 08/20/2015   . meloxicam (MOBIC) 15 MG tablet Take 1 tablet (15 mg total) by mouth daily. (Patient not taking: Reported on 08/20/2015)    No facility-administered encounter medications on file as of 08/20/2015.   Allergies  Allergen Reactions   . Codeine   . Penicillins   . Sulfa Antibiotics     Told as a child by her mother never to take  . Fish Allergy Diarrhea    Scaled fish   ROS:  No nausea, vomiting, diarrhea, bleeding, bruising, rash. No ear pain, chest pain, palpitations. +URI symptoms.  See HPI   PHYSICAL EXAM: BP 126/74 mmHg  Pulse 80  Temp(Src) 99.3 F (37.4 C) (Tympanic)  Ht 5\' 3"  (1.6 m)  Wt 100 lb (45.36 kg)  BMI 17.72 kg/m2   Well appearing, thin female in no distress.  Rare dry cough during visit HEENT: PERRL, EOMI, conjunctiva and sclera are clear.  L hemotympanum, R TM and EAC normal. Nasal mucosa is mildly edematous and erythematous, with light green mucus noted.  OP is clear, moist mucus membranes, no erythema.  Sinuses nontender. Neck: no lymphadenopathy, thyromegaly or mass Heart: regular rate and rhythm without murmur Lungs: clear bilaterally Skin: normal turgor, no rash Neuro: alert and oriented, cranial nerves intact, normal gait Psych: normal mood, affect, hygiene and grooming    ASSESSMENT/PLAN:  Acute sinusitis, recurrence not specified, unspecified location - Plan: azithromycin (ZITHROMAX) 250 MG tablet   Acute sinusitis, otitis  Treat with zpak.  Drink plenty of fluids. Continue the alka selzer cold plus medication, since it has been helpful.  Reviewing the ingredients, it appears that it contains decontestants, antihistamine and a cough suppressant.  Don't use other cough syrups along with this.  You could use a plain  guaifenesin (expectorant--plain robitussin or mucinex, but no combination medications).   Try use cough drops/lozenges to help with the cough. Call us for a prescription of tessalon if your cough isn't improving.    Return if you have persistent fevers, worsening cough, shortness of breath or other new symptoms.  Remember that it takes a full 10 days for the zpak to finish.  Call on day 11 if you aren't 100% better, for a refill.  If you are not ANY better, or  worse by 5-7 days, please call for a change in antibiotics.

## 2015-08-24 DIAGNOSIS — M5431 Sciatica, right side: Secondary | ICD-10-CM | POA: Diagnosis not present

## 2015-08-26 ENCOUNTER — Encounter (HOSPITAL_COMMUNITY)
Admission: RE | Admit: 2015-08-26 | Discharge: 2015-08-26 | Disposition: A | Discharge status: Home / Self Care | Payer: Medicare Other | Source: Ambulatory Visit | Attending: General Surgery | Admitting: General Surgery

## 2015-08-26 ENCOUNTER — Encounter (HOSPITAL_COMMUNITY): Payer: Self-pay

## 2015-08-26 ENCOUNTER — Other Ambulatory Visit (HOSPITAL_COMMUNITY): Payer: Self-pay | Admitting: *Deleted

## 2015-08-26 DIAGNOSIS — Z01812 Encounter for preprocedural laboratory examination: Secondary | ICD-10-CM | POA: Diagnosis not present

## 2015-08-26 DIAGNOSIS — C50911 Malignant neoplasm of unspecified site of right female breast: Secondary | ICD-10-CM | POA: Insufficient documentation

## 2015-08-26 HISTORY — DX: Adverse effect of unspecified anesthetic, initial encounter: T41.45XA

## 2015-08-26 HISTORY — DX: Other specified postprocedural states: Z98.890

## 2015-08-26 HISTORY — DX: Other complications of anesthesia, initial encounter: T88.59XA

## 2015-08-26 HISTORY — DX: Other specified postprocedural states: R11.2

## 2015-08-26 LAB — CBC
HCT: 41.8 % (ref 36.0–46.0)
HEMOGLOBIN: 13.9 g/dL (ref 12.0–15.0)
MCH: 29.1 pg (ref 26.0–34.0)
MCHC: 33.3 g/dL (ref 30.0–36.0)
MCV: 87.6 fL (ref 78.0–100.0)
Platelets: 372 10*3/uL (ref 150–400)
RBC: 4.77 MIL/uL (ref 3.87–5.11)
RDW: 13.4 % (ref 11.5–15.5)
WBC: 9 10*3/uL (ref 4.0–10.5)

## 2015-08-26 NOTE — Pre-Procedure Instructions (Addendum)
Kelsey Fuller  08/26/2015      [facility] - [place] [place] [address] Phone: [phone] Fax: [phone]  [place] [address] [place] [address] Phone: [phone] Fax: [phone]  [facility] - [place], [place] [address] AT [facility] OF [address] [place] [address] Phone: [phone] Fax: [phone]    Your procedure is scheduled on Tuesday, Sep 01, 2015 at 9:30 AM.  Report to [facility] Entrance "A" Admitting Office at 7:30 AM.  Call this number if you have problems the morning of surgery: [phone]  Any questions prior to day of surgery, please call [phone] between 8 & 4 PM.   Remember:  Do not eat food or drink liquids after midnight Monday, 08/31/15.  Take these medicines the morning of surgery with A SIP OF WATER: Tylenol - if needed, Flovent inhaler, Spiriva inhaler  Stop Aspirin, Alka-Seltzer Plus Cold, NSAIDS (Meloxicam, Ibuprofen, Aleve, etc.) and Multivitamins as of today.also vit D   Do not wear jewelry, make-up or nail polish.  Do not wear lotions, powders, or perfumes.  You may NOT wear deodorant.  Do not shave 48 hours prior to surgery.    Do not bring valuables to the hospital.  [facility] is not responsible for any belongings or valuables.  Contacts, dentures or bridgework may not be worn into surgery.  Leave your suitcase in the car.  After surgery it may be brought to your room.  For patients admitted to the hospital, discharge time will be determined by your treatment team.  Patients discharged the day of surgery will not be allowed to drive home.   Special instructions:  [place] - Preparing for Surgery  Before surgery, you can play an important role.  Because skin is not sterile, your skin needs to be as free of germs  as possible.  You can reduce the number of germs on you skin by washing with CHG (chlorahexidine gluconate) soap before surgery.  CHG is an antiseptic cleaner which kills germs and bonds with the skin to continue killing germs even after washing.  Please DO NOT use if you have an allergy to CHG or antibacterial soaps.  If your skin becomes reddened/irritated stop using the CHG and inform your nurse when you arrive at Short Stay.  Do not shave (including legs and underarms) for at least 48 hours prior to the first CHG shower.  You may shave your face.  Please follow these instructions carefully:   1.  Shower with CHG Soap the night before surgery and the                                morning of Surgery.  2.  If you choose to wash your hair, wash your hair first as usual with your       normal shampoo.  3.  After you shampoo, rinse your hair and body thoroughly to remove the                      Shampoo.  4.  Use CHG as you would any other liquid soap.  You can apply chg directly       to the skin and wash gently  with scrungie or a clean washcloth.  5.  Apply the CHG Soap to your body ONLY FROM THE NECK DOWN.        Do not use on open wounds or open sores.  Avoid contact with your eyes, ears, mouth and genitals (private parts).  Wash genitals (private parts) with your normal soap.  6.  Wash thoroughly, paying special attention to the area where your surgery        will be performed.  7.  Thoroughly rinse your body with warm water from the neck down.  8.  DO NOT shower/wash with your normal soap after using and rinsing off       the CHG Soap.  9.  Pat yourself dry with a clean towel.            10.  Wear clean pajamas.            11.  Place clean sheets on your bed the night of your first shower and do not        sleep with pets.  Day of Surgery  Do not apply any lotions/deodorants the morning of surgery.  Please wear clean clothes to the hospital.   Please read over the following fact sheets  that you were given. Pain Booklet, Coughing and Deep Breathing and Surgical Site Infection Prevention

## 2015-08-31 ENCOUNTER — Ambulatory Visit
Admission: RE | Admit: 2015-08-31 | Discharge: 2015-08-31 | Disposition: A | Discharge status: Home / Self Care | Payer: Medicare Other | Source: Ambulatory Visit | Attending: General Surgery | Admitting: General Surgery

## 2015-08-31 DIAGNOSIS — C50211 Malignant neoplasm of upper-inner quadrant of right female breast: Secondary | ICD-10-CM

## 2015-08-31 DIAGNOSIS — C50911 Malignant neoplasm of unspecified site of right female breast: Secondary | ICD-10-CM | POA: Diagnosis not present

## 2015-08-31 MED ORDER — CIPROFLOXACIN IN D5W 400 MG/200ML IV SOLN
400.0000 mg | INTRAVENOUS | Status: AC
  Administered 2015-09-01: 400 mg via INTRAVENOUS
  Filled 2015-08-31: qty 200

## 2015-09-01 ENCOUNTER — Ambulatory Visit (HOSPITAL_COMMUNITY)
Admission: RE | Admit: 2015-09-01 | Discharge: 2015-09-01 | Disposition: A | Discharge status: Home / Self Care | Payer: Medicare Other | Source: Ambulatory Visit | Attending: General Surgery | Admitting: General Surgery

## 2015-09-01 ENCOUNTER — Ambulatory Visit (HOSPITAL_COMMUNITY): Payer: Medicare Other | Admitting: Anesthesiology

## 2015-09-01 ENCOUNTER — Encounter (HOSPITAL_COMMUNITY): Payer: Self-pay | Admitting: General Surgery

## 2015-09-01 ENCOUNTER — Encounter (HOSPITAL_COMMUNITY)
Admission: RE | Disposition: A | Discharge status: Home / Self Care | Payer: Self-pay | Source: Ambulatory Visit | Attending: General Surgery

## 2015-09-01 ENCOUNTER — Ambulatory Visit
Admission: RE | Admit: 2015-09-01 | Discharge: 2015-09-01 | Disposition: A | Discharge status: Home / Self Care | Payer: Medicare Other | Source: Ambulatory Visit | Attending: General Surgery | Admitting: General Surgery

## 2015-09-01 DIAGNOSIS — Z7951 Long term (current) use of inhaled steroids: Secondary | ICD-10-CM | POA: Insufficient documentation

## 2015-09-01 DIAGNOSIS — Z170 Estrogen receptor positive status [ER+]: Secondary | ICD-10-CM | POA: Diagnosis not present

## 2015-09-01 DIAGNOSIS — Z87891 Personal history of nicotine dependence: Secondary | ICD-10-CM | POA: Insufficient documentation

## 2015-09-01 DIAGNOSIS — J449 Chronic obstructive pulmonary disease, unspecified: Secondary | ICD-10-CM | POA: Diagnosis not present

## 2015-09-01 DIAGNOSIS — C50911 Malignant neoplasm of unspecified site of right female breast: Secondary | ICD-10-CM | POA: Diagnosis not present

## 2015-09-01 DIAGNOSIS — R634 Abnormal weight loss: Secondary | ICD-10-CM | POA: Diagnosis not present

## 2015-09-01 DIAGNOSIS — Z681 Body mass index (BMI) 19 or less, adult: Secondary | ICD-10-CM | POA: Diagnosis not present

## 2015-09-01 DIAGNOSIS — Z79899 Other long term (current) drug therapy: Secondary | ICD-10-CM | POA: Insufficient documentation

## 2015-09-01 DIAGNOSIS — R928 Other abnormal and inconclusive findings on diagnostic imaging of breast: Secondary | ICD-10-CM | POA: Diagnosis not present

## 2015-09-01 DIAGNOSIS — Z7982 Long term (current) use of aspirin: Secondary | ICD-10-CM | POA: Insufficient documentation

## 2015-09-01 DIAGNOSIS — C50211 Malignant neoplasm of upper-inner quadrant of right female breast: Secondary | ICD-10-CM

## 2015-09-01 DIAGNOSIS — D0591 Unspecified type of carcinoma in situ of right breast: Secondary | ICD-10-CM | POA: Diagnosis present

## 2015-09-01 HISTORY — PX: BREAST LUMPECTOMY WITH RADIOACTIVE SEED LOCALIZATION: SHX6424

## 2015-09-01 SURGERY — BREAST LUMPECTOMY WITH RADIOACTIVE SEED LOCALIZATION
Anesthesia: General | Site: Breast | Laterality: Right

## 2015-09-01 MED ORDER — LIDOCAINE HCL 1 % IJ SOLN
INTRAMUSCULAR | Status: DC | PRN
  Administered 2015-09-01: 10 mL

## 2015-09-01 MED ORDER — SODIUM CHLORIDE 0.9 % IJ SOLN
INTRAMUSCULAR | Status: AC
  Filled 2015-09-01: qty 30

## 2015-09-01 MED ORDER — PROPOFOL 10 MG/ML IV BOLUS
INTRAVENOUS | Status: AC
  Filled 2015-09-01: qty 20

## 2015-09-01 MED ORDER — LACTATED RINGERS IV SOLN
INTRAVENOUS | Status: DC
  Administered 2015-09-01: 08:00:00 via INTRAVENOUS

## 2015-09-01 MED ORDER — ONDANSETRON HCL 4 MG/2ML IJ SOLN
INTRAMUSCULAR | Status: AC
  Filled 2015-09-01: qty 2

## 2015-09-01 MED ORDER — HYDROCODONE-ACETAMINOPHEN 5-325 MG PO TABS: 1.0000 | ORAL_TABLET | ORAL | Status: DC | PRN

## 2015-09-01 MED ORDER — ARTIFICIAL TEARS OP OINT
TOPICAL_OINTMENT | OPHTHALMIC | Status: AC
  Filled 2015-09-01: qty 3.5

## 2015-09-01 MED ORDER — FENTANYL CITRATE (PF) 100 MCG/2ML IJ SOLN: 25.0000 ug | INTRAMUSCULAR | Status: DC | PRN

## 2015-09-01 MED ORDER — BUPIVACAINE-EPINEPHRINE (PF) 0.25% -1:200000 IJ SOLN
INTRAMUSCULAR | Status: AC
  Filled 2015-09-01: qty 30

## 2015-09-01 MED ORDER — ROCURONIUM BROMIDE 50 MG/5ML IV SOLN
INTRAVENOUS | Status: AC
  Filled 2015-09-01: qty 1

## 2015-09-01 MED ORDER — DEXAMETHASONE SODIUM PHOSPHATE 10 MG/ML IJ SOLN
INTRAMUSCULAR | Status: AC
  Filled 2015-09-01: qty 1

## 2015-09-01 MED ORDER — LABETALOL HCL 5 MG/ML IV SOLN
INTRAVENOUS | Status: AC
  Filled 2015-09-01: qty 4

## 2015-09-01 MED ORDER — LIDOCAINE HCL (CARDIAC) 20 MG/ML IV SOLN
INTRAVENOUS | Status: DC | PRN
  Administered 2015-09-01: 50 mg via INTRAVENOUS

## 2015-09-01 MED ORDER — LIDOCAINE HCL (PF) 1 % IJ SOLN
INTRAMUSCULAR | Status: AC
  Filled 2015-09-01: qty 30

## 2015-09-01 MED ORDER — DEXAMETHASONE SODIUM PHOSPHATE 10 MG/ML IJ SOLN
INTRAMUSCULAR | Status: DC | PRN
  Administered 2015-09-01: 5 mg via INTRAVENOUS

## 2015-09-01 MED ORDER — SCOPOLAMINE 1 MG/3DAYS TD PT72
1.0000 | MEDICATED_PATCH | TRANSDERMAL | Status: DC
  Filled 2015-09-01: qty 1

## 2015-09-01 MED ORDER — ONDANSETRON HCL 4 MG/2ML IJ SOLN
INTRAMUSCULAR | Status: DC | PRN
  Administered 2015-09-01: 4 mg via INTRAVENOUS

## 2015-09-01 MED ORDER — PROPOFOL 10 MG/ML IV BOLUS
INTRAVENOUS | Status: DC | PRN
  Administered 2015-09-01: 120 mg via INTRAVENOUS

## 2015-09-01 MED ORDER — 0.9 % SODIUM CHLORIDE (POUR BTL) OPTIME
TOPICAL | Status: DC | PRN
  Administered 2015-09-01: 1000 mL

## 2015-09-01 MED ORDER — SCOPOLAMINE 1 MG/3DAYS TD PT72
MEDICATED_PATCH | TRANSDERMAL | Status: DC | PRN
  Administered 2015-09-01: 1 via TRANSDERMAL

## 2015-09-01 MED ORDER — VECURONIUM BROMIDE 10 MG IV SOLR
INTRAVENOUS | Status: AC
  Filled 2015-09-01: qty 10

## 2015-09-01 MED ORDER — FENTANYL CITRATE (PF) 100 MCG/2ML IJ SOLN
INTRAMUSCULAR | Status: DC | PRN
  Administered 2015-09-01: 50 ug via INTRAVENOUS
  Administered 2015-09-01: 100 ug via INTRAVENOUS

## 2015-09-01 MED ORDER — FENTANYL CITRATE (PF) 250 MCG/5ML IJ SOLN
INTRAMUSCULAR | Status: AC
  Filled 2015-09-01: qty 5

## 2015-09-01 MED ORDER — DEXMEDETOMIDINE HCL IN NACL 200 MCG/50ML IV SOLN
INTRAVENOUS | Status: AC
  Filled 2015-09-01: qty 50

## 2015-09-01 SURGICAL SUPPLY — 52 items
APPLIER CLIP 9.375 MED OPEN (MISCELLANEOUS)
APR CLP MED 9.3 20 MLT OPN (MISCELLANEOUS)
BINDER BREAST LRG (GAUZE/BANDAGES/DRESSINGS) ×2 IMPLANT
BINDER BREAST XLRG (GAUZE/BANDAGES/DRESSINGS) IMPLANT
BLADE SURG 15 STRL LF DISP TIS (BLADE) ×1 IMPLANT
BLADE SURG 15 STRL SS (BLADE) ×3
CANISTER SUCTION 2500CC (MISCELLANEOUS) IMPLANT
CHLORAPREP W/TINT 26ML (MISCELLANEOUS) ×3 IMPLANT
CLIP APPLIE 9.375 MED OPEN (MISCELLANEOUS) IMPLANT
CLIP TI LARGE 6 (CLIP) ×2 IMPLANT
CLOSURE WOUND 1/2 X4 (GAUZE/BANDAGES/DRESSINGS) ×1
COVER PROBE W GEL 5X96 (DRAPES) ×3 IMPLANT
COVER SURGICAL LIGHT HANDLE (MISCELLANEOUS) ×3 IMPLANT
DEVICE DUBIN SPECIMEN MAMMOGRA (MISCELLANEOUS) ×3 IMPLANT
DRAPE CHEST BREAST 15X10 FENES (DRAPES) ×3 IMPLANT
DRAPE UTILITY XL STRL (DRAPES) ×3 IMPLANT
ELECT CAUTERY BLADE 6.4 (BLADE) ×3 IMPLANT
ELECT REM PT RETURN 9FT ADLT (ELECTROSURGICAL) ×3
ELECTRODE REM PT RTRN 9FT ADLT (ELECTROSURGICAL) ×1 IMPLANT
GLOVE BIO SURGEON STRL SZ 6 (GLOVE) ×3 IMPLANT
GLOVE BIO SURGEON STRL SZ7 (GLOVE) ×4 IMPLANT
GLOVE BIOGEL PI IND STRL 6.5 (GLOVE) ×1 IMPLANT
GLOVE BIOGEL PI IND STRL 7.0 (GLOVE) IMPLANT
GLOVE BIOGEL PI INDICATOR 6.5 (GLOVE) ×4
GLOVE BIOGEL PI INDICATOR 7.0 (GLOVE) ×2
GOWN STRL REUS W/ TWL LRG LVL3 (GOWN DISPOSABLE) ×1 IMPLANT
GOWN STRL REUS W/TWL 2XL LVL3 (GOWN DISPOSABLE) ×3 IMPLANT
GOWN STRL REUS W/TWL LRG LVL3 (GOWN DISPOSABLE) ×3
ILLUMINATOR WAVEGUIDE N/F (MISCELLANEOUS) IMPLANT
KIT BASIN OR (CUSTOM PROCEDURE TRAY) ×3 IMPLANT
KIT MARKER MARGIN INK (KITS) ×3 IMPLANT
LIGHT WAVEGUIDE WIDE FLAT (MISCELLANEOUS) ×2 IMPLANT
NDL HYPO 25X1 1.5 SAFETY (NEEDLE) ×1 IMPLANT
NEEDLE HYPO 25X1 1.5 SAFETY (NEEDLE) ×3 IMPLANT
NS IRRIG 1000ML POUR BTL (IV SOLUTION) IMPLANT
PACK SURGICAL SETUP 50X90 (CUSTOM PROCEDURE TRAY) ×3 IMPLANT
PENCIL BUTTON HOLSTER BLD 10FT (ELECTRODE) ×3 IMPLANT
SPONGE LAP 18X18 X RAY DECT (DISPOSABLE) ×3 IMPLANT
STRIP CLOSURE SKIN 1/2X4 (GAUZE/BANDAGES/DRESSINGS) ×1 IMPLANT
SUT MNCRL AB 4-0 PS2 18 (SUTURE) ×3 IMPLANT
SUT SILK 2 0 SH (SUTURE) IMPLANT
SUT VIC AB 2-0 SH 27 (SUTURE) ×3
SUT VIC AB 2-0 SH 27XBRD (SUTURE) ×1 IMPLANT
SUT VIC AB 3-0 SH 27 (SUTURE) ×3
SUT VIC AB 3-0 SH 27X BRD (SUTURE) ×1 IMPLANT
SYR BULB 3OZ (MISCELLANEOUS) ×3 IMPLANT
SYR CONTROL 10ML LL (SYRINGE) ×3 IMPLANT
TOWEL OR 17X24 6PK STRL BLUE (TOWEL DISPOSABLE) ×3 IMPLANT
TOWEL OR 17X26 10 PK STRL BLUE (TOWEL DISPOSABLE) ×1 IMPLANT
TUBE CONNECTING 12'X1/4 (SUCTIONS) ×1
TUBE CONNECTING 12X1/4 (SUCTIONS) ×1 IMPLANT
YANKAUER SUCT BULB TIP NO VENT (SUCTIONS) ×2 IMPLANT

## 2015-09-01 NOTE — Interval H&P Note (Signed)
History and Physical Interval Note:  09/01/2015 8:38 AM  Kelsey Fuller  has presented today for surgery, with the diagnosis of right breast cancer  The various methods of treatment have been discussed with the patient and family. After consideration of risks, benefits and other options for treatment, the patient has consented to  Procedure(s): RIGHT BREAST LUMPECTOMY WITH RADIOACTIVE SEED LOCALIZATION (Right) as a surgical intervention .  The patient's history has been reviewed, patient examined, no change in status, stable for surgery.  I have reviewed the patient's chart and labs.  Questions were answered to the patient's satisfaction.     [name]

## 2015-09-01 NOTE — Op Note (Signed)
Right Breast Radioactive seed localized lumpectomy  Indications: This patient presents with history of Right breast cancer  Pre-operative Diagnosis: cTis right breast cancer, upper inner quadrant  Post-operative Diagnosis: same  Surgeon: Stark Klein   Anesthesia: General endotracheal anesthesia  ASA Class: 3  Procedure Details  The patient was seen in the Holding Room. The risks, benefits, complications, treatment options, and expected outcomes were discussed with the patient. The possibilities of bleeding, infection, the need for additional procedures, failure to diagnose a condition, and creating a complication requiring transfusion or operation were discussed with the patient. The patient concurred with the proposed plan, giving informed consent.  The site of surgery properly noted/marked. The patient was taken to Operating Room # 2, identified, and the procedure verified as Right Breast seed localized Lumpectomy. A Time Out was held and the above information confirmed.  The right breast and chest were prepped and draped in standard fashion. The lumpectomy was performed by creating an transverse incision over the upper inner quadrant of the breast over the previously placed radioactive seed.  Dissection was carried down to around the point of maximum signal intensity. The cautery was used to perform the dissection.  Hemostasis was achieved with cautery. The edges of the cavity were marked with large clips, with one each medial, lateral, inferior and superior, and two clips posteriorly.   The specimen was inked with the margin marker paint kit.    Specimen radiography confirmed inclusion of the mammographic lesion, the clip, and the seed.  The background signal in the breast was zero.  The wound was irrigated and closed with 2-0 vicryl interrupted deep sutures, 3-0 vicryl deep dermal interrupte  in layers and 4-0 monocryl running subcuticular suture.      Sterile dressings were applied. At the  end of the operation, all sponge, instrument, and needle counts were correct.  Findings: grossly clear surgical margins and no adenopathy, posterior margin is pectoralis, anterior margin is skin.    Estimated Blood Loss:  min         Specimens: right breast lumpectomy.           Complications:  None; patient tolerated the procedure well.         Disposition: PACU - hemodynamically stable.         Condition: stable

## 2015-09-01 NOTE — Anesthesia Preprocedure Evaluation (Addendum)
Anesthesia Evaluation  Patient identified by MRN, date of birth, ID band Patient awake    Reviewed: Allergy & Precautions, H&P , NPO status , Patient's Chart, lab work & pertinent test results  History of Anesthesia Complications (+) PONV  Airway Mallampati: II  TM Distance: >3 FB Neck ROM: full    Dental  (+) Dental Advisory Given, Edentulous Upper, Edentulous Lower   Pulmonary shortness of breath and with exertion, asthma , COPD,  COPD inhaler, former smoker,    Pulmonary exam normal breath sounds clear to auscultation       Cardiovascular Exercise Tolerance: Good negative cardio ROS Normal cardiovascular exam Rhythm:regular Rate:Normal     Neuro/Psych negative neurological ROS  negative psych ROS   GI/Hepatic negative GI ROS, Neg liver ROS,   Endo/Other  negative endocrine ROS  Renal/GU negative Renal ROS  negative genitourinary   Musculoskeletal   Abdominal   Peds  Hematology negative hematology ROS (+)   Anesthesia Other Findings   Reproductive/Obstetrics negative OB ROS                            Anesthesia Physical Anesthesia Plan  ASA: III  Anesthesia Plan: General   Post-op Pain Management:    Induction: Intravenous  Airway Management Planned: LMA  Additional Equipment:   Intra-op Plan:   Post-operative Plan:   Informed Consent: I have reviewed the patients History and Physical, chart, labs and discussed the procedure including the risks, benefits and alternatives for the proposed anesthesia with the patient or authorized representative who has indicated his/her understanding and acceptance.   Dental Advisory Given  Plan Discussed with: CRNA  Anesthesia Plan Comments:         Anesthesia Quick Evaluation

## 2015-09-01 NOTE — H&P (Signed)
Kelsey Fuller 08/12/2015 1:00 PM Location: [facility] Patient #: [id number] DOB: 1940/09/14 Undefined / Language: Cleophus Molt / Race: White Female   History of Present Illness Stark Klein MD; 08/12/2015 4:42 PM) The patient is a 75 year old female who presents with breast cancer. Pt is a 75 yo F who presented with right breast pain. Diagnostic mammography was performed which demonstrated a 5 mm mass in the upper inner quadrant of the right breast. Ultrasound was similar. 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 She has not had prior breast biopsies. She has never been recalled for mammogram. She denies palpable mass, nipple discharge, or skin contour difference.   She had menarche at age 89. She is a G4P3 with first child in early 12s and menopause in late 7s. She has no family cancer history.      Dx mammogram/us 07/29/15 Targeted ultrasound is performed, showing an irregular, hypoechoic mass at 2 o'clock, 6 cm from the nipple measuring 4 x 4 x 3 mm, which is thought to correspond to the mammographic finding. Targeted ultrasound the right axilla demonstrates no suspicious appearing axillary lymph nodes.  IMPRESSION: Indeterminate right breast mass at 2 o'clock.  Pathology Diagnosis Breast, right, needle core biopsy, 2 o'clock - DUCTAL CARCINOMA IN SITU WITH NECROSIS, SEE COMMENT. Intermediate grade, ER/PR positive.  CBC, CMET essentially normal.    Other Problems Stark Klein, MD; 08/11/2015 3:56 PM) Arthritis Back Pain Chronic Obstructive Lung Disease  Past Surgical History Stark Klein, MD; 08/11/2015 3:56 PM) Appendectomy Breast Biopsy Right.  Diagnostic Studies History Stark Klein, MD; 08/11/2015 3:56 PM) Colonoscopy 1-5 years ago Mammogram within last year Pap Smear 1-5 years ago  Social History Stark Klein, MD; 08/11/2015 3:56 PM) Alcohol use Occasional alcohol use. Caffeine use  Carbonated beverages, Coffee, Tea. No drug use Tobacco use Former smoker.  Family History Stark Klein, MD; 08/11/2015 3:56 PM) Arthritis Mother. Heart Disease Father, Mother, Sister. Heart disease in female family member before age 46 Heart disease in female family member before age 53 Migraine Headache Mother, Sister. Thyroid problems Mother.  Pregnancy / Birth History Stark Klein, MD; 08/11/2015 3:56 PM) Age at menarche 43 years. Age of menopause 5-50 Gravida 72 Maternal age 84-25 Para 79    Review of Systems Stark Klein MD; 08/11/2015 3:56 PM) General Present- Weight Loss. Not Present- Appetite Loss, Chills, Fatigue, Fever, Night Sweats and Weight Gain. Skin Not Present- Change in Wart/Mole, Dryness, Hives, Jaundice, New Lesions, Non-Healing Wounds, Rash and Ulcer. HEENT Present- Seasonal Allergies and Wears glasses/contact lenses. Not Present- Earache, Hearing Loss, Hoarseness, Nose Bleed, Oral Ulcers, Ringing in the Ears, Sinus Pain, Sore Throat, Visual Disturbances and Yellow Eyes. Respiratory Not Present- Bloody sputum, Chronic Cough, Difficulty Breathing, Snoring and Wheezing. Breast Present- Breast Mass and Breast Pain. Not Present- Nipple Discharge and Skin Changes. Cardiovascular Present- Leg Cramps. Not Present- Chest Pain, Difficulty Breathing Lying Down, Palpitations, Rapid Heart Rate, Shortness of Breath and Swelling of Extremities. Gastrointestinal Not Present- Abdominal Pain, Bloating, Bloody Stool, Change in Bowel Habits, Chronic diarrhea, Constipation, Difficulty Swallowing, Excessive gas, Gets full quickly at meals, Hemorrhoids, Indigestion, Nausea, Rectal Pain and Vomiting. Female Genitourinary Not Present- Frequency, Nocturia, Painful Urination, Pelvic Pain and Urgency. Musculoskeletal Present- Back Pain. Not Present- Joint Pain, Joint Stiffness, Muscle Pain, Muscle Weakness and Swelling of Extremities. Neurological Not Present- Decreased Memory,  Fainting, Headaches, Numbness, Seizures, Tingling, Tremor, Trouble walking and Weakness. Psychiatric Not Present- Anxiety, Bipolar, Change in Sleep Pattern, Depression, Fearful  and Frequent crying. Endocrine Not Present- Cold Intolerance, Excessive Hunger, Hair Changes, Heat Intolerance, Hot flashes and New Diabetes. Hematology Not Present- Easy Bruising, Excessive bleeding, Gland problems, HIV and Persistent Infections.  Vitals Stark Klein MD; 08/12/2015 4:44 PM) 08/12/2015 4:44 PM Weight: 98.2 lb Height: 63in Body Surface Area: 1.43 m Body Mass Index: 17.4 kg/m  Temp.: 97.52F  Pulse: 102 (Regular)  Resp.: 18 (Unlabored)  BP: 155/93 (Sitting, Left Arm, Standard)       Physical Exam Stark Klein MD; 08/12/2015 4:43 PM) General Mental Status-Alert. General Appearance-Consistent with stated age. Hydration-Well hydrated. Voice-Normal.  Head and Neck Head-normocephalic, atraumatic with no lesions or palpable masses. Trachea-midline. Thyroid Gland Characteristics - normal size and consistency.  Eye Eyeball - Bilateral-Extraocular movements intact. Sclera/Conjunctiva - Bilateral-No scleral icterus.  Chest and Lung Exam Chest and lung exam reveals -quiet, even and easy respiratory effort with no use of accessory muscles and on auscultation, normal breath sounds, no adventitious sounds and normal vocal resonance. Inspection Chest Wall - Normal. Back - normal.  Breast Note: right breast sore at bx site. ptosis bilaterally. symmetric breasts. no palpable masses, no nipple discharge or reetraction. no axillary LAD>   Cardiovascular Cardiovascular examination reveals -normal heart sounds, regular rate and rhythm with no murmurs and normal pedal pulses bilaterally.  Abdomen Inspection Inspection of the abdomen reveals - No Hernias. Palpation/Percussion Palpation and Percussion of the abdomen reveal - Soft, Non Tender, No Rebound tenderness, No  Rigidity (guarding) and No hepatosplenomegaly. Auscultation Auscultation of the abdomen reveals - Bowel sounds normal.  Neurologic Neurologic evaluation reveals -alert and oriented x 3 with no impairment of recent or remote memory. Mental Status-Normal.  Musculoskeletal Global Assessment -Note: no gross deformities.  Normal Exam - Left-Upper Extremity Strength Normal and Lower Extremity Strength Normal. Normal Exam - Right-Upper Extremity Strength Normal and Lower Extremity Strength Normal.  Lymphatic Head & Neck  General Head & Neck Lymphatics: Bilateral - Description - Normal. Axillary  General Axillary Region: Bilateral - Description - Normal. Tenderness - Non Tender. Femoral & Inguinal  Generalized Femoral & Inguinal Lymphatics: Bilateral - Description - No Generalized lymphadenopathy.    Assessment & Plan Stark Klein MD; 08/12/2015 4:43 PM) PRIMARY CANCER OF UPPER INNER QUADRANT OF RIGHT FEMALE BREAST (C50.211) Impression: Patient has a new diagosis of DCIS on the right. She is a good candidate for breast conservation.  We will plan a seed localized lumpectomy followed by adjuvant antiestrogen therapy. She may be able to forgo radiation.  The surgical procedure was described to the patient. I discussed the incision type and location and that we would need radiology involved on with a wire or seed marker and/or sentinel node.  The risks and benefits of the procedure were described to the patient and she wishes to proceed.  We discussed the risks bleeding, infection, damage to other structures, need for further procedures/surgeries. We discussed the risk of seroma. The patient was advised if the area in the breast in cancer, we may need to go back to surgery for additional tissue to obtain negative margins or for a lymph node biopsy. The patient was advised that these are the most common complications, but that others can occur as well. They were advised against  taking aspirin or other anti-inflammatory agents/blood thinners the week before surgery.  45 min spent in evaluation, examination, counseling, and coordination of care. >50% spent in counseling. Current Plans You are being scheduled for surgery - Our schedulers will call you.  You should hear from  our office's scheduling department within 5 working days about the location, date, and time of surgery. We try to make accommodations for patient's preferences in scheduling surgery, but sometimes the OR schedule or the surgeon's schedule prevents [place] from making those accommodations.  If you have not heard from our office [phone]) in 5 working days, call the office and ask for your surgeon's nurse.  If you have other questions about your diagnosis, plan, or surgery, call the office and ask for your surgeon's nurse.  Pt Education - flb breast cancer surgery: discussed with patient and provided information.   Signed by Stark Klein, MD (08/12/2015 4:45 PM)

## 2015-09-01 NOTE — Transfer of Care (Signed)
Immediate Anesthesia Transfer of Care Note  Patient: Kelsey Fuller  Procedure(s) Performed: Procedure(s): RADIOACTIVE SEED GUIDED RIGHT BREAST LUMPECTOMY (Right)  Patient Location: PACU  Anesthesia Type:General  Level of Consciousness: awake, alert  and oriented  Airway & Oxygen Therapy: Patient Spontanous Breathing and Patient connected to nasal cannula oxygen  Post-op Assessment: Report given to RN and Post -op Vital signs reviewed and stable  Post vital signs: Reviewed and stable  Last Vitals:  Filed Vitals:   09/01/15 0808  BP: 169/79  Pulse: 83  Temp: 36.7 C  Resp: 18    Last Pain: There were no vitals filed for this visit.       Complications: No apparent anesthesia complications

## 2015-09-01 NOTE — Anesthesia Procedure Notes (Signed)
Procedure Name: LMA Insertion Date/Time: 09/01/2015 9:11 AM Performed by: Mariea Clonts Pre-anesthesia Checklist: Emergency Drugs available, Patient identified, Timeout performed, Suction available and Patient being monitored Oxygen Delivery Method: Circle system utilized Preoxygenation: Pre-oxygenation with 100% oxygen Intubation Type: IV induction LMA: LMA inserted LMA Size: 3.0 Placement Confirmation: breath sounds checked- equal and bilateral and positive ETCO2 Tube secured with: Tape Dental Injury: Teeth and Oropharynx as per pre-operative assessment

## 2015-09-01 NOTE — Discharge Instructions (Signed)
[facility] °Office Phone Number [phone] ° °BREAST BIOPSY/ PARTIAL MASTECTOMY: POST OP INSTRUCTIONS ° °Always review your discharge instruction sheet given to you by the facility where your surgery was performed. ° °IF YOU HAVE DISABILITY OR FAMILY LEAVE FORMS, YOU MUST BRING THEM TO THE OFFICE FOR PROCESSING.  DO NOT GIVE THEM TO YOUR DOCTOR. ° °1. A prescription for pain medication may be given to you upon discharge.  Take your pain medication as prescribed, if needed.  If narcotic pain medicine is not needed, then you may take acetaminophen (Tylenol) or ibuprofen (Advil) as needed. °2. Take your usually prescribed medications unless otherwise directed °3. If you need a refill on your pain medication, please contact your pharmacy.  They will contact our office to request authorization.  Prescriptions will not be filled after 5pm or on week-ends. °4. You should eat very light the first 24 hours after surgery, such as soup, crackers, pudding, etc.  Resume your normal diet the day after surgery. °5. Most patients will experience some swelling and bruising in the breast.  Ice packs and a good support bra will help.  Swelling and bruising can take several days to resolve.  °6. It is common to experience some constipation if taking pain medication after surgery.  Increasing fluid intake and taking a stool softener will usually help or prevent this problem from occurring.  A mild laxative (Milk of Magnesia or Miralax) should be taken according to package directions if there are no bowel movements after 48 hours. °7. Unless discharge instructions indicate otherwise, you may remove your bandages 48 hours after surgery, and you may shower at that time.  You may have steri-strips (small skin tapes) in place directly over the incision.  These strips should be left on the skin for 7-10 days.   Any sutures or staples will be removed at the office during your follow-up visit. °8. ACTIVITIES:  You may resume  regular daily activities (gradually increasing) beginning the next day.  Wearing a good support bra or sports bra (or the breast binder) minimizes pain and swelling.  You may have sexual intercourse when it is comfortable. °a. You may drive when you no longer are taking prescription pain medication, you can comfortably wear a seatbelt, and you can safely maneuver your car and apply brakes. °b. RETURN TO WORK:  __________1 week_______________ °9. You should see your doctor in the office for a follow-up appointment approximately two weeks after your surgery.  Your doctor’s nurse will typically make your follow-up appointment when she calls you with your pathology report.  Expect your pathology report 2-3 business days after your surgery.  You may call to check if you do not hear from us after three days. ° ° °WHEN TO CALL YOUR DOCTOR: °1. Fever over 101.0 °2. Nausea and/or vomiting. °3. Extreme swelling or bruising. °4. Continued bleeding from incision. °5. Increased pain, redness, or drainage from the incision. ° °The clinic staff is available to answer your questions during regular business hours.  Please don’t hesitate to call and ask to speak to one of the nurses for clinical concerns.  If you have a medical emergency, go to the nearest emergency room or call 911.  A surgeon from [facility] is always on call at the hospital. ° °For further questions, please visit [email or web address]  ° °

## 2015-09-01 NOTE — Anesthesia Postprocedure Evaluation (Signed)
Anesthesia Post Note  Patient: Kelsey Fuller  Procedure(s) Performed: Procedure(s) (LRB): RADIOACTIVE SEED GUIDED RIGHT BREAST LUMPECTOMY (Right)  Patient location during evaluation: PACU Anesthesia Type: General Level of consciousness: awake and alert Pain management: pain level controlled Vital Signs Assessment: post-procedure vital signs reviewed and stable Respiratory status: spontaneous breathing, nonlabored ventilation, respiratory function stable and patient connected to nasal cannula oxygen Cardiovascular status: blood pressure returned to baseline and stable Postop Assessment: no signs of nausea or vomiting Anesthetic complications: no    Last Vitals:  Filed Vitals:   09/01/15 1030 09/01/15 1045  BP: 142/69 149/61  Pulse: 67 67  Temp:  36.7 C  Resp: 15 18    Last Pain: There were no vitals filed for this visit.               Sheronda Parran L

## 2015-09-02 ENCOUNTER — Encounter (HOSPITAL_COMMUNITY): Payer: Self-pay | Admitting: General Surgery

## 2015-09-02 NOTE — Progress Notes (Signed)
Quick Note:  Please let patient know margins are negative. Very small amt of invasive cancer seen. ______

## 2015-09-03 ENCOUNTER — Encounter: Payer: Medicare Other | Admitting: Family Medicine

## 2015-09-14 ENCOUNTER — Telehealth: Payer: Self-pay | Admitting: Hematology and Oncology

## 2015-09-14 ENCOUNTER — Ambulatory Visit (HOSPITAL_BASED_OUTPATIENT_CLINIC_OR_DEPARTMENT_OTHER): Payer: Medicare Other | Admitting: Hematology and Oncology

## 2015-09-14 ENCOUNTER — Encounter: Payer: Self-pay | Admitting: Hematology and Oncology

## 2015-09-14 VITALS — BP 166/72 | HR 77 | Temp 97.6°F | Resp 18 | Ht 63.0 in | Wt 99.6 lb

## 2015-09-14 DIAGNOSIS — C50211 Malignant neoplasm of upper-inner quadrant of right female breast: Secondary | ICD-10-CM | POA: Diagnosis not present

## 2015-09-14 NOTE — Assessment & Plan Note (Signed)
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 We discussed that the final pathology showed evidence of invasive breast cancer about from DCIS. I discussed the margins as well as lymph node surgeries. We also discussed the final staging along with ER/PR and HER-2/neu testing.  Plan: 1. No indication for chemotherapy because of final tumor size was less than 0.5 cm 2. Adjuvant radiation therapy followed by 3. Adjuvant antiestrogen therapy  Return to clinic in 3 months for follow-up to start antiestrogen treatment with anastrozole 1 mg daily 5 years

## 2015-09-14 NOTE — Telephone Encounter (Signed)
appt made and avs printed °

## 2015-09-14 NOTE — Progress Notes (Signed)
 Patient Care Team: Eve Knapp, MD as PCP - General (Family Medicine)  DIAGNOSIS: Breast cancer of upper-inner quadrant of right female breast (HCC)   Staging form: Breast, AJCC 7th Edition     Clinical stage from 08/12/2015: Stage 0 (Tis (DCIS), N0, M0) - Unsigned       Staging comments: Staged at breast conference on 5.3.17  SUMMARY OF ONCOLOGIC HISTORY:   Breast cancer of upper-inner quadrant of right female breast (HCC)   08/06/2015 Mammogram Right breast mammogram and ultrasound revealed asymmetry posteriorly 4 x 4 x 3 mm, axilla negative,Tis N0 stage 0   08/06/2015 Initial Diagnosis Right breast biopsy 2:00 position goal DCIS with necrosis intermediate grade,ER 100%, PR 85%,?foci of early stromal invasion   09/01/2015 Surgery Right lumpectomy: IDC grade 2, 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 Originally she was found to have DCIS with suspicion of stromal invasion. 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 We presented her case in the tumor board and we did not recommend lymph node dissection because of her age as well as the small size of the tumor. She plans to see radiation oncology for adjuvant radiation therapy. She is recovering very well from surgery.  REVIEW OF SYSTEMS:   Constitutional: Denies fevers, chills or abnormal weight loss Eyes: Denies blurriness of vision Ears, nose, mouth, throat, and face: Denies mucositis or sore throat Respiratory: Denies cough, dyspnea or wheezes Cardiovascular: Denies palpitation, chest discomfort Gastrointestinal:  Denies nausea, heartburn or change in bowel habits Skin: Denies abnormal skin rashes Lymphatics: Denies new  lymphadenopathy or easy bruising Neurological:Denies numbness, tingling or new weaknesses Behavioral/Psych: Mood is stable, no new changes  Extremities: No lower extremity edema Breast:  Recent right lumpectomy All other systems were reviewed with the patient and are negative.  I have reviewed the past medical history, past surgical history, social history and family history with the patient and they are unchanged from previous note.  ALLERGIES:  is allergic to codeine; penicillins; sulfa antibiotics; and fish allergy.  MEDICATIONS:  Current Outpatient Prescriptions  Medication Sig Dispense Refill  . acetaminophen (TYLENOL) 325 MG tablet Take 650 mg by mouth every 6 (six) hours as needed. Reported on 08/20/2015    . aspirin 81 MG tablet Take 81 mg by mouth daily.    . Chlorphen-Phenyleph-ASA (ALKA-SELTZER PLUS COLD PO) Take 2 capsules by mouth every 4 (four) hours as needed.    . cholecalciferol (VITAMIN D) 1000 units tablet Take 1,000 Units by mouth daily.    . fluticasone (FLOVENT HFA) 110 MCG/ACT inhaler Inhale 1 puff into the lungs 2 (two) times daily.    . HYDROcodone-acetaminophen (NORCO/VICODIN) 5-325 MG tablet Take 1-2 tablets by mouth every 4 (four) hours as needed for moderate pain or severe pain. 30 tablet 0  . ibuprofen (ADVIL,MOTRIN) 200 MG tablet Take 600 mg by mouth every 8 (eight) hours as needed for moderate pain.    . latanoprost (XALATAN) 0.005 % ophthalmic solution Place 1 drop into both eyes at bedtime.   3  . meloxicam (MOBIC) 15 MG tablet Take 15 mg by mouth daily as needed for pain.    . Multiple Vitamins-Minerals (ALIVE WOMENS 50+ PO) Take 1 tablet by   mouth daily.    . SPIRIVA HANDIHALER 18 MCG inhalation capsule PLACE 1 CAPSULE INTO INHALER AND INHALE THE CONTENTS DAILY 90 capsule 0   No current facility-administered medications for this visit.    PHYSICAL EXAMINATION: ECOG PERFORMANCE STATUS: 1 - Symptomatic but completely ambulatory  Filed Vitals:    09/14/15 1043  BP: 166/72  Pulse: 77  Temp: 97.6 F (36.4 C)  Resp: 18   Filed Weights   09/14/15 1043  Weight: 99 lb 9.6 oz (45.178 kg)    GENERAL:alert, no distress and comfortable SKIN: skin color, texture, turgor are normal, no rashes or significant lesions EYES: normal, Conjunctiva are pink and non-injected, sclera clear OROPHARYNX:no exudate, no erythema and lips, buccal mucosa, and tongue normal  NECK: supple, thyroid normal size, non-tender, without nodularity LYMPH:  no palpable lymphadenopathy in the cervical, axillary or inguinal LUNGS: clear to auscultation and percussion with normal breathing effort HEART: regular rate & rhythm and no murmurs and no lower extremity edema ABDOMEN:abdomen soft, non-tender and normal bowel sounds MUSCULOSKELETAL:no cyanosis of digits and no clubbing  NEURO: alert & oriented x 3 with fluent speech, no focal motor/sensory deficits EXTREMITIES: No lower extremity edema   LABORATORY DATA:  I have reviewed the data as listed   Chemistry      Component Value Date/Time   NA 141 08/12/2015 1233   NA 140 10/29/2014 0001   K 4.0 08/12/2015 1233   K 4.1 10/29/2014 0001   CL 104 10/29/2014 0001   CO2 26 08/12/2015 1233   CO2 26 10/29/2014 0001   BUN 16.1 08/12/2015 1233   BUN 17 10/29/2014 0001   CREATININE 0.8 08/12/2015 1233   CREATININE 0.68 10/29/2014 0001      Component Value Date/Time   CALCIUM 9.8 08/12/2015 1233   CALCIUM 9.1 10/29/2014 0001   ALKPHOS 92 08/12/2015 1233   ALKPHOS 80 10/29/2014 0001   AST 18 08/12/2015 1233   AST 14 10/29/2014 0001   ALT 16 08/12/2015 1233   ALT 10 10/29/2014 0001   BILITOT 0.90 08/12/2015 1233   BILITOT 0.7 10/29/2014 0001       Lab Results  Component Value Date   WBC 9.0 08/26/2015   HGB 13.9 08/26/2015   HCT 41.8 08/26/2015   MCV 87.6 08/26/2015   PLT 372 08/26/2015   NEUTROABS 4.9 08/12/2015     ASSESSMENT & PLAN:  Breast cancer of upper-inner quadrant of right female  breast (HCC) Right lumpectomy 09/01/2015: IDC grade 2, 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 We discussed that the final pathology showed evidence of invasive breast cancer about from DCIS. I discussed the margins as well as lymph node surgeries. We also discussed the final staging along with ER/PR and HER-2/neu testing.  Plan: 1. No indication for chemotherapy because of final tumor size was less than 0.5 cm 2. Adjuvant radiation therapy followed by 3. Adjuvant antiestrogen therapy  Return to clinic in 3 months for follow-up to start antiestrogen treatment with anastrozole 1 mg daily 5 years   No orders of the defined types were placed in this encounter.   The patient has a good understanding of the overall plan. she agrees with it. she will call with any problems that may develop before the next visit here.   Gudena, Vinay K, MD 09/14/2015     

## 2015-09-16 NOTE — Progress Notes (Signed)
Location of Breast Cancer:Right Breast Upper Inner Quadrant 2 o'clock (45m mass)  Histology per Pathology Report: Diagnosis 08/06/15: Initial Dx: Breast, right, needle core biopsy, 2 o'clock - DUCTAL CARCINOMA IN SITU WITH NECROSIS  Receptor Status: ER(100+), PR (40%+), Her2-neu (neg), Ki-6710%  Did patient present with symptoms (if so, please note symptoms) or was this found on screening mammography?: Patient had pain in breast  Then went for mammogram  Past/Anticipated interventions by surgeon, if aYDX:AJOINOMVE5/23/17: Dr. FRalene Muskrat Breast, lumpectomy, Right - INVASIVE GRADE II DUCTAL CARCINOMA, SPANNING 0.4 CM IN GREATEST LINEAR DIMENSION.- ASSOCIATED INTERMEDIATE GRADE DUCTAL CARCINOMA IN SITU.- LYMPH/VASCULAR INVASION IS IDENTIFIED. - MARGINS ARE NEGATIVE  Past/Anticipated interventions by medical oncology, if any: Chemotherapy : Dr. GChanetta Marshall6/5/17:no chemotherapy final tumor<0.5cm, f/u adjuvant antiestrogen after radiation therapy  Lymphedema issues, if any:  NO   Pain issues, if any:  Right lower sciatica pain radiates to right ankle none in breast, Right breast incsion healing well, steri strips and glue over site  SAFETY ISSUES:  Prior radiation? NO  Pacemaker/ICD? NO  Possible current pregnancy?N/A  Is the patient on methotrexate? NO   Current Complaints / other details:  Widowed,menarche 12,  G4P3, former smoker, occasional alcohol use, ,Father Mother &Sister heart disease, no hx cancer in family   Allergies: Codeine,PCNS,Sulfa antibiotics,fish allergy MRebecca Eaton RN 09/16/2015,1:50 PM  BP 155/65 mmHg  Pulse 83  Temp(Src) 97.5 F (36.4 C) (Oral)  Resp 16  Ht '5\' 3"'$  (1.6 m)  Wt 99 lb 14.4 oz (45.314 kg)  BMI 17.70 kg/m2  Wt Readings from Last 3 Encounters:  09/21/15 99 lb 14.4 oz (45.314 kg)  09/14/15 99 lb 9.6 oz (45.178 kg)  09/01/15 98 lb 5 oz (44.594 kg)

## 2015-09-17 ENCOUNTER — Encounter: Payer: Self-pay | Admitting: Radiation Oncology

## 2015-09-21 ENCOUNTER — Ambulatory Visit
Admission: RE | Admit: 2015-09-21 | Discharge: 2015-09-21 | Disposition: A | Discharge status: Home / Self Care | Payer: Medicare Other | Source: Ambulatory Visit | Attending: Radiation Oncology | Admitting: Radiation Oncology

## 2015-09-21 ENCOUNTER — Encounter: Payer: Self-pay | Admitting: Radiation Oncology

## 2015-09-21 VITALS — BP 155/65 | HR 83 | Temp 97.5°F | Resp 16 | Ht 63.0 in | Wt 99.9 lb

## 2015-09-21 DIAGNOSIS — Z9889 Other specified postprocedural states: Secondary | ICD-10-CM | POA: Diagnosis not present

## 2015-09-21 DIAGNOSIS — C50211 Malignant neoplasm of upper-inner quadrant of right female breast: Secondary | ICD-10-CM | POA: Insufficient documentation

## 2015-09-21 DIAGNOSIS — Z510 Encounter for antineoplastic radiation therapy: Secondary | ICD-10-CM | POA: Insufficient documentation

## 2015-09-21 HISTORY — DX: Allergy, unspecified, initial encounter: T78.40XA

## 2015-09-21 NOTE — Progress Notes (Signed)
Radiation Oncology         [phone] ________________________________  Name: Kelsey Fuller MRN: [id number]  Date: 09/21/2015  DOB: 1940/09/07  Follow-Up Visit Note  CC: Kelsey Ports, MD  Kelsey Lose, MD  Diagnosis:   Breast cancer of the upper inner quadrant of the right female breast.  Narrative:  The patient returns today for routine follow-up.  I saw her last in breast conference 08/11/2015. Since then, she had a right lumpectomy 09/01/2015, revealing invasive grade II ductal carcinoma with negative margins. She mentions that she has right lower sciatica pain that radiates to her right ankle. She denies any pain in her breast.   She is here today to discuss radiation treatment options.                              ALLERGIES:  is allergic to codeine; penicillins; sulfa antibiotics; and fish allergy.  Meds: Current Outpatient Prescriptions  Medication Sig Dispense Refill  . acetaminophen (TYLENOL) 325 MG tablet Take 650 mg by mouth every 6 (six) hours as needed. Reported on 08/20/2015    . aspirin 81 MG tablet Take 81 mg by mouth daily.    . Chlorphen-Phenyleph-ASA (ALKA-SELTZER PLUS COLD PO) Take 2 capsules by mouth every 4 (four) hours as needed.    . cholecalciferol (VITAMIN D) 1000 units tablet Take 1,000 Units by mouth daily.    . fluticasone (FLOVENT HFA) 110 MCG/ACT inhaler Inhale 1 puff into the lungs 2 (two) times daily.    Marland Kitchen HYDROcodone-acetaminophen (NORCO/VICODIN) 5-325 MG tablet Take 1-2 tablets by mouth every 4 (four) hours as needed for moderate pain or severe pain. 30 tablet 0  . ibuprofen (ADVIL,MOTRIN) 200 MG tablet Take 600 mg by mouth every 8 (eight) hours as needed for moderate pain.    Marland Kitchen latanoprost (XALATAN) 0.005 % ophthalmic solution Place 1 drop into both eyes at bedtime.   3  . meloxicam (MOBIC) 15 MG tablet Take 15 mg by mouth daily as needed for pain.    . Multiple Vitamins-Minerals (ALIVE WOMENS 50+ PO) Take 1 tablet by mouth daily.    Marland Kitchen SPIRIVA  HANDIHALER 18 MCG inhalation capsule PLACE 1 CAPSULE INTO INHALER AND INHALE THE CONTENTS DAILY 90 capsule 0   No current facility-administered medications for this encounter.    Physical Findings: The patient is in no acute distress. Patient is alert and oriented.  height is '5\' 3"'$  (1.6 m) and weight is 99 lb 14.4 oz (45.314 kg). Her oral temperature is 97.5 F (36.4 C). Her blood pressure is 155/65 and her pulse is 83. Her respiration is 16. .    Lab Findings: Lab Results  Component Value Date   WBC 9.0 08/26/2015   HGB 13.9 08/26/2015   HCT 41.8 08/26/2015   MCV 87.6 08/26/2015   PLT 372 08/26/2015     Radiographic Findings: Mm Breast Surgical Specimen  09/01/2015  CLINICAL DATA:  Post surgical excision of right breast lesion. EXAM: SPECIMEN RADIOGRAPH OF THE RIGHT BREAST COMPARISON:  Previous exam(s). FINDINGS: Status post excision of the right breast. The radioactive seed and biopsy marker clip are present, completely intact, and were marked for pathology. IMPRESSION: Specimen radiograph of the right breast. Electronically Signed   By: Altamese Cabal M.D.   On: 09/01/2015 12:15   Mm Rt Radioactive Seed Loc Mammo Guide  08/31/2015  CLINICAL DATA:  Patient presents for radioactive seed localization of a small right breast  carcinoma. EXAM: MAMMOGRAPHIC GUIDED RADIOACTIVE SEED LOCALIZATION OF THE RIGHT BREAST COMPARISON:  Previous exam(s). FINDINGS: Patient presents for radioactive seed localization prior to surgical excision. I met with the patient and we discussed the procedure of seed localization including benefits and alternatives. We discussed the high likelihood of a successful procedure. We discussed the risks of the procedure including infection, bleeding, tissue injury and further surgery. We discussed the low dose of radioactivity involved in the procedure. Informed, written consent was given. The usual time-out protocol was performed immediately prior to the procedure. Using  mammographic guidance, sterile technique, 1% lidocaine and an I-125 radioactive seed, the ribbon shaped biopsy clip was localized using a medial approach. The follow-up mammogram images confirm the seed in the expected location and were marked for Dr. Barry Dienes. Follow-up survey of the patient confirms presence of the radioactive seed. Order number of I-125 seed:  [id number]. Total activity:  1.103 millicuries  Reference Date: 08/28/2015 The patient tolerated the procedure well and was released from the [place]. She was given instructions regarding seed removal. IMPRESSION: Radioactive seed localization of the right breast. No apparent complications. Electronically Signed   By: Lajean Manes M.D.   On: 08/31/2015 14:28    Impression:    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 She is a good candidate for external radiation and antiestrogen therapy following radiation.  The patient's staging changed after lumpectomy as she initially presented with DCIS on biopsy. However, the patient's lumpectomy specimen did demonstrate invasive ductal carcinoma. I believe however that given her performance status, she remains a good candidate for hyperfractionated course of radiation treatment which was discussed initially.  Plan: I spoke to the patient today regarding her diagnosis and options for treatment. We discussed the equivalence in terms of survival and local failure between mastectomy and breast conservation. We discussed the role of radiation in decreasing local failures in patients who undergo lumpectomy. We discussed the process of simulation and the placement tattoos. We discussed 4 weeks of treatment as an outpatient. We discussed the low likelihood of secondary malignancies. We discussed the possible side effects including but not limited to skin redness, fatigue, permanent skin darkening, and breast swelling. This procedure has been fully reviewed with the patient  and written informed consent has been obtained. Her simulation appointment is scheduled later this week.   She has a follow up appointment with Dr. Barry Dienes in 6 months.  ------------------------------------------------  Jodelle Gross, MD, PhD    This document serves as a record of services personally performed by Kyung Rudd, MD. It was created on his behalf by  Lendon Collar, a trained medical scribe. The creation of this record is based on the scribe's personal observations and the provider's statements to them. This document has been checked and approved by the attending provider.

## 2015-09-21 NOTE — Progress Notes (Signed)
Please see the Nurse Progress Note in the MD Initial Consult Encounter for this patient. 

## 2015-09-22 ENCOUNTER — Encounter: Payer: Self-pay | Admitting: Radiation Oncology

## 2015-09-23 ENCOUNTER — Ambulatory Visit
Admission: RE | Admit: 2015-09-23 | Discharge: 2015-09-23 | Disposition: A | Discharge status: Home / Self Care | Payer: Medicare Other | Source: Ambulatory Visit | Attending: Radiation Oncology | Admitting: Radiation Oncology

## 2015-09-23 DIAGNOSIS — C50211 Malignant neoplasm of upper-inner quadrant of right female breast: Secondary | ICD-10-CM

## 2015-09-23 DIAGNOSIS — Z9889 Other specified postprocedural states: Secondary | ICD-10-CM | POA: Diagnosis not present

## 2015-09-23 DIAGNOSIS — Z510 Encounter for antineoplastic radiation therapy: Secondary | ICD-10-CM | POA: Diagnosis not present

## 2015-09-25 DIAGNOSIS — C50211 Malignant neoplasm of upper-inner quadrant of right female breast: Secondary | ICD-10-CM | POA: Diagnosis not present

## 2015-09-25 DIAGNOSIS — Z510 Encounter for antineoplastic radiation therapy: Secondary | ICD-10-CM | POA: Diagnosis not present

## 2015-09-25 DIAGNOSIS — Z9889 Other specified postprocedural states: Secondary | ICD-10-CM | POA: Diagnosis not present

## 2015-09-30 ENCOUNTER — Ambulatory Visit
Admission: RE | Admit: 2015-09-30 | Discharge: 2015-09-30 | Disposition: A | Discharge status: Home / Self Care | Payer: Medicare Other | Source: Ambulatory Visit | Attending: Radiation Oncology | Admitting: Radiation Oncology

## 2015-09-30 DIAGNOSIS — C50211 Malignant neoplasm of upper-inner quadrant of right female breast: Secondary | ICD-10-CM | POA: Diagnosis not present

## 2015-09-30 DIAGNOSIS — Z9889 Other specified postprocedural states: Secondary | ICD-10-CM | POA: Diagnosis not present

## 2015-09-30 DIAGNOSIS — Z510 Encounter for antineoplastic radiation therapy: Secondary | ICD-10-CM | POA: Diagnosis not present

## 2015-10-01 ENCOUNTER — Ambulatory Visit
Admission: RE | Admit: 2015-10-01 | Discharge: 2015-10-01 | Disposition: A | Discharge status: Home / Self Care | Payer: Medicare Other | Source: Ambulatory Visit | Attending: Radiation Oncology | Admitting: Radiation Oncology

## 2015-10-01 DIAGNOSIS — Z510 Encounter for antineoplastic radiation therapy: Secondary | ICD-10-CM | POA: Diagnosis not present

## 2015-10-01 DIAGNOSIS — C50211 Malignant neoplasm of upper-inner quadrant of right female breast: Secondary | ICD-10-CM | POA: Diagnosis not present

## 2015-10-01 DIAGNOSIS — Z9889 Other specified postprocedural states: Secondary | ICD-10-CM | POA: Diagnosis not present

## 2015-10-01 MED ORDER — ALRA NON-METALLIC DEODORANT (RAD-ONC)
1.0000 "application " | Freq: Once | TOPICAL | Status: AC
  Administered 2015-10-01: 1 via TOPICAL

## 2015-10-01 MED ORDER — RADIAPLEXRX EX GEL
Freq: Once | CUTANEOUS | Status: AC
  Administered 2015-10-01: 12:00:00 via TOPICAL

## 2015-10-01 NOTE — Progress Notes (Signed)
Patient education done, radiation therapy and you book given with alra and radiaplex gel, my business card, discussed ways to manage side effects ,skin iritation, sweling tenderness breast,pain, fatigue, increase protein in diet, stay hydrated, drink more water/fluids, patient gave verbal understanding, can use electric razor if needed to shave, sees MD Aldean Ast RN weekly and prn,teach back method done 11:39 AM

## 2015-10-02 ENCOUNTER — Encounter: Payer: Self-pay | Admitting: Radiation Oncology

## 2015-10-02 ENCOUNTER — Ambulatory Visit
Admission: RE | Admit: 2015-10-02 | Discharge: 2015-10-02 | Disposition: A | Discharge status: Home / Self Care | Payer: Medicare Other | Source: Ambulatory Visit | Attending: Radiation Oncology | Admitting: Radiation Oncology

## 2015-10-02 VITALS — BP 149/68 | HR 81 | Temp 97.7°F | Resp 16 | Wt 100.8 lb

## 2015-10-02 DIAGNOSIS — Z510 Encounter for antineoplastic radiation therapy: Secondary | ICD-10-CM | POA: Diagnosis not present

## 2015-10-02 DIAGNOSIS — Z9889 Other specified postprocedural states: Secondary | ICD-10-CM | POA: Diagnosis not present

## 2015-10-02 DIAGNOSIS — C50211 Malignant neoplasm of upper-inner quadrant of right female breast: Secondary | ICD-10-CM | POA: Diagnosis not present

## 2015-10-02 NOTE — Progress Notes (Signed)
  Radiation Oncology         [id number]   Name: Kelsey Fuller MRN: [id number]   Date: 10/02/2015  DOB: 1941-04-02   Weekly Radiation Therapy Management  No diagnosis found.  Current Dose: 5 Gy  Planned Dose:  50 Gy  Narrative The patient presents for routine under treatment assessment.  Weekly rad tx right breast 2/20. The patient has no complaints of pain and has a good appetite. The nurse notes no skin changes, the patient has dry peeling/flaky skin (even on her back), and steri-strips still on the breast where the incision area is located.  Set-up films were reviewed. The chart was checked.  Physical Findings  weight is 100 lb 12.8 oz (45.723 kg). Her oral temperature is 97.7 F (36.5 C). Her blood pressure is 149/68 and her pulse is 81. Her respiration is 16. . Weight essentially stable.  No significant changes. Skin is dry. The right lumpectomy scar is intact, no cellulitus, seperation or bleeding.  Impression The patient is tolerating radiation.  Plan Continue treatment as planned.     Sheral Apley Tammi Klippel, M.D.  This document serves as a record of services personally performed by Tyler Pita, MD. It was created on his behalf by Darcus Austin, a trained medical scribe. The creation of this record is based on the scribe's personal observations and the provider's statements to them. This document has been checked and approved by the attending provider.

## 2015-10-02 NOTE — Progress Notes (Signed)
weekly rad tx right breast 2/20 no skin changes, has dry peeling/flacky skin even on her back,  steri-strips still on breast when incision area is,  Gave radaiplex gel  Yesterday with education, no c/o pain, appetite gogod 11:14 AM BP 149/68 mmHg  Pulse 81  Temp(Src) 97.7 F (36.5 C) (Oral)  Resp 16  Wt 100 lb 12.8 oz (45.723 kg)  Wt Readings from Last 3 Encounters:  10/02/15 100 lb 12.8 oz (45.723 kg)  09/21/15 99 lb 14.4 oz (45.314 kg)  09/14/15 99 lb 9.6 oz (45.178 kg)

## 2015-10-05 ENCOUNTER — Ambulatory Visit
Admission: RE | Admit: 2015-10-05 | Discharge: 2015-10-05 | Disposition: A | Discharge status: Home / Self Care | Payer: Medicare Other | Source: Ambulatory Visit | Attending: Radiation Oncology | Admitting: Radiation Oncology

## 2015-10-05 DIAGNOSIS — C50211 Malignant neoplasm of upper-inner quadrant of right female breast: Secondary | ICD-10-CM | POA: Diagnosis not present

## 2015-10-05 DIAGNOSIS — Z510 Encounter for antineoplastic radiation therapy: Secondary | ICD-10-CM | POA: Diagnosis not present

## 2015-10-05 DIAGNOSIS — Z9889 Other specified postprocedural states: Secondary | ICD-10-CM | POA: Diagnosis not present

## 2015-10-06 ENCOUNTER — Ambulatory Visit
Admission: RE | Admit: 2015-10-06 | Discharge: 2015-10-06 | Disposition: A | Discharge status: Home / Self Care | Payer: Medicare Other | Source: Ambulatory Visit | Attending: Radiation Oncology | Admitting: Radiation Oncology

## 2015-10-06 DIAGNOSIS — Z510 Encounter for antineoplastic radiation therapy: Secondary | ICD-10-CM | POA: Diagnosis not present

## 2015-10-06 DIAGNOSIS — Z9889 Other specified postprocedural states: Secondary | ICD-10-CM | POA: Diagnosis not present

## 2015-10-06 DIAGNOSIS — C50211 Malignant neoplasm of upper-inner quadrant of right female breast: Secondary | ICD-10-CM | POA: Diagnosis not present

## 2015-10-07 ENCOUNTER — Ambulatory Visit
Admission: RE | Admit: 2015-10-07 | Discharge: 2015-10-07 | Disposition: A | Discharge status: Home / Self Care | Payer: Medicare Other | Source: Ambulatory Visit | Attending: Radiation Oncology | Admitting: Radiation Oncology

## 2015-10-07 DIAGNOSIS — C50211 Malignant neoplasm of upper-inner quadrant of right female breast: Secondary | ICD-10-CM | POA: Diagnosis not present

## 2015-10-07 DIAGNOSIS — Z510 Encounter for antineoplastic radiation therapy: Secondary | ICD-10-CM | POA: Diagnosis not present

## 2015-10-07 DIAGNOSIS — Z9889 Other specified postprocedural states: Secondary | ICD-10-CM | POA: Diagnosis not present

## 2015-10-08 ENCOUNTER — Ambulatory Visit
Admission: RE | Admit: 2015-10-08 | Discharge: 2015-10-08 | Disposition: A | Discharge status: Home / Self Care | Payer: Medicare Other | Source: Ambulatory Visit | Attending: Radiation Oncology | Admitting: Radiation Oncology

## 2015-10-08 ENCOUNTER — Telehealth: Payer: Self-pay | Admitting: *Deleted

## 2015-10-08 DIAGNOSIS — Z510 Encounter for antineoplastic radiation therapy: Secondary | ICD-10-CM | POA: Diagnosis not present

## 2015-10-08 DIAGNOSIS — Z9889 Other specified postprocedural states: Secondary | ICD-10-CM | POA: Diagnosis not present

## 2015-10-08 DIAGNOSIS — C50211 Malignant neoplasm of upper-inner quadrant of right female breast: Secondary | ICD-10-CM | POA: Diagnosis not present

## 2015-10-08 NOTE — Telephone Encounter (Signed)
Left message to follow up after start of radiation.   

## 2015-10-09 ENCOUNTER — Ambulatory Visit
Admission: RE | Admit: 2015-10-09 | Discharge: 2015-10-09 | Disposition: A | Discharge status: Home / Self Care | Payer: Medicare Other | Source: Ambulatory Visit | Attending: Radiation Oncology | Admitting: Radiation Oncology

## 2015-10-09 ENCOUNTER — Encounter: Payer: Self-pay | Admitting: Radiation Oncology

## 2015-10-09 VITALS — BP 140/75 | HR 79 | Temp 97.7°F | Resp 20 | Wt 101.3 lb

## 2015-10-09 DIAGNOSIS — Z510 Encounter for antineoplastic radiation therapy: Secondary | ICD-10-CM | POA: Diagnosis not present

## 2015-10-09 DIAGNOSIS — C50211 Malignant neoplasm of upper-inner quadrant of right female breast: Secondary | ICD-10-CM

## 2015-10-09 DIAGNOSIS — Z9889 Other specified postprocedural states: Secondary | ICD-10-CM | POA: Diagnosis not present

## 2015-10-09 NOTE — Progress Notes (Signed)
Weekly rad txs right breast, skin still flaky dry, no redness, steri strips still over incision site,  Using radiapelx bid, appetite improving stated no apin BP 140/75 mmHg  Pulse 79  Temp(Src) 97.7 F (36.5 C) (Oral)  Resp 20  Wt 101 lb 4.8 oz (45.949 kg)  Wt Readings from Last 3 Encounters:  10/09/15 101 lb 4.8 oz (45.949 kg)  10/02/15 100 lb 12.8 oz (45.723 kg)  09/21/15 99 lb 14.4 oz (45.314 kg)  BP 140/75 mmHg  Pulse 79  Temp(Src) 97.7 F (36.5 C) (Oral)  Resp 20  Wt 101 lb 4.8 oz (45.949 kg)

## 2015-10-09 NOTE — Progress Notes (Signed)
Department of Radiation Oncology  Phone:  [phone] Fax:        [phone]  Weekly Treatment Note    Name: Kelsey Fuller Date: 10/09/2015 MRN: [id number] DOB: 1940-11-04   Diagnosis:     ICD-9-CM ICD-10-CM   1. 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 acetaminophen (TYLENOL) 325 MG tablet Take 650 mg by mouth every 6 (six) hours as needed. Reported on 08/20/2015    . aspirin 81 MG tablet Take 81 mg by mouth daily.    . Chlorphen-Phenyleph-ASA (ALKA-SELTZER PLUS COLD PO) Take 2 capsules by mouth every 4 (four) hours as needed.    . cholecalciferol (VITAMIN D) 1000 units tablet Take 1,000 Units by mouth daily.    . fluticasone (FLOVENT HFA) 110 MCG/ACT inhaler Inhale 1 puff into the lungs 2 (two) times daily. Reported on 10/02/2015    . hyaluronate sodium (RADIAPLEXRX) GEL Apply 1 application topically 2 (two) times daily.    Marland Kitchen HYDROcodone-acetaminophen (NORCO/VICODIN) 5-325 MG tablet Take 1-2 tablets by mouth every 4 (four) hours as needed for moderate pain or severe pain. 30 tablet 0  . ibuprofen (ADVIL,MOTRIN) 200 MG tablet Take 600 mg by mouth every 8 (eight) hours as needed for moderate pain.    Marland Kitchen latanoprost (XALATAN) 0.005 % ophthalmic solution Place 1 drop into both eyes at bedtime.   3  . meloxicam (MOBIC) 15 MG tablet Take 15 mg by mouth daily as needed for pain.    . Multiple Vitamins-Minerals (ALIVE WOMENS 50+ PO) Take 1 tablet by mouth daily.    . non-metallic deodorant Jethro Poling) MISC Apply 1 application topically daily as needed.    Marland Kitchen SPIRIVA HANDIHALER 18 MCG inhalation capsule PLACE 1 CAPSULE INTO INHALER AND INHALE THE CONTENTS DAILY 90 capsule 0   No current facility-administered medications for this encounter.     ALLERGIES: Codeine; Penicillins; Sulfa antibiotics; and Fish  allergy   LABORATORY DATA:  Lab Results  Component Value Date   WBC 9.0 08/26/2015   HGB 13.9 08/26/2015   HCT 41.8 08/26/2015   MCV 87.6 08/26/2015   PLT 372 08/26/2015   Lab Results  Component Value Date   NA 141 08/12/2015   K 4.0 08/12/2015   CL 104 10/29/2014   CO2 26 08/12/2015   Lab Results  Component Value Date   ALT 16 08/12/2015   AST 18 08/12/2015   ALKPHOS 92 08/12/2015   BILITOT 0.90 08/12/2015     NARRATIVE: Kelsey Fuller was seen today for weekly treatment management. The chart was checked and the patient's films were reviewed.  Weekly rad txs right breast, skin still flaky dry, no redness, steri strips still over incision site,  Using radiapelx bid, appetite improving stated no apin BP 140/75 mmHg  Pulse 79  Temp(Src) 97.7 F (36.5 C) (Oral)  Resp 20  Wt 101 lb 4.8 oz (45.949 kg)  Wt Readings from Last 3 Encounters:  10/09/15 101 lb 4.8 oz (45.949 kg)  10/02/15 100 lb 12.8 oz (45.723 kg)  09/21/15 99 lb 14.4 oz (45.314 kg)  BP 140/75 mmHg  Pulse 79  Temp(Src) 97.7 F (36.5 C) (Oral)  Resp 20  Wt 101 lb 4.8 oz (45.949 kg)  PHYSICAL EXAMINATION: weight is 101 lb 4.8 oz (45.949 kg). Her oral temperature is 97.7 F (36.5 C).  Her blood pressure is 140/75 and her pulse is 79. Her respiration is 20.        ASSESSMENT: The patient is doing satisfactorily with treatment.  PLAN: We will continue with the patient's radiation treatment as planned.

## 2015-10-12 ENCOUNTER — Ambulatory Visit
Admission: RE | Admit: 2015-10-12 | Discharge: 2015-10-12 | Disposition: A | Discharge status: Home / Self Care | Payer: Medicare Other | Source: Ambulatory Visit | Attending: Radiation Oncology | Admitting: Radiation Oncology

## 2015-10-12 DIAGNOSIS — Z9889 Other specified postprocedural states: Secondary | ICD-10-CM | POA: Diagnosis not present

## 2015-10-12 DIAGNOSIS — Z510 Encounter for antineoplastic radiation therapy: Secondary | ICD-10-CM | POA: Diagnosis not present

## 2015-10-12 DIAGNOSIS — C50211 Malignant neoplasm of upper-inner quadrant of right female breast: Secondary | ICD-10-CM | POA: Diagnosis not present

## 2015-10-14 ENCOUNTER — Ambulatory Visit
Admission: RE | Admit: 2015-10-14 | Discharge: 2015-10-14 | Disposition: A | Discharge status: Home / Self Care | Payer: Medicare Other | Source: Ambulatory Visit | Attending: Radiation Oncology | Admitting: Radiation Oncology

## 2015-10-14 DIAGNOSIS — Z510 Encounter for antineoplastic radiation therapy: Secondary | ICD-10-CM | POA: Diagnosis not present

## 2015-10-14 DIAGNOSIS — Z9889 Other specified postprocedural states: Secondary | ICD-10-CM | POA: Diagnosis not present

## 2015-10-14 DIAGNOSIS — C50211 Malignant neoplasm of upper-inner quadrant of right female breast: Secondary | ICD-10-CM | POA: Diagnosis not present

## 2015-10-15 ENCOUNTER — Ambulatory Visit
Admission: RE | Admit: 2015-10-15 | Discharge: 2015-10-15 | Disposition: A | Discharge status: Home / Self Care | Payer: Medicare Other | Source: Ambulatory Visit | Attending: Radiation Oncology | Admitting: Radiation Oncology

## 2015-10-15 DIAGNOSIS — Z9889 Other specified postprocedural states: Secondary | ICD-10-CM | POA: Diagnosis not present

## 2015-10-15 DIAGNOSIS — C50211 Malignant neoplasm of upper-inner quadrant of right female breast: Secondary | ICD-10-CM | POA: Diagnosis not present

## 2015-10-15 DIAGNOSIS — Z510 Encounter for antineoplastic radiation therapy: Secondary | ICD-10-CM | POA: Diagnosis not present

## 2015-10-16 ENCOUNTER — Ambulatory Visit
Admission: RE | Admit: 2015-10-16 | Discharge: 2015-10-16 | Disposition: A | Discharge status: Home / Self Care | Payer: Medicare Other | Source: Ambulatory Visit | Attending: Radiation Oncology | Admitting: Radiation Oncology

## 2015-10-16 ENCOUNTER — Encounter: Payer: Self-pay | Admitting: Radiation Oncology

## 2015-10-16 VITALS — BP 144/71 | HR 86 | Temp 98.4°F | Resp 16 | Wt 100.4 lb

## 2015-10-16 DIAGNOSIS — C50211 Malignant neoplasm of upper-inner quadrant of right female breast: Secondary | ICD-10-CM | POA: Diagnosis not present

## 2015-10-16 DIAGNOSIS — Z510 Encounter for antineoplastic radiation therapy: Secondary | ICD-10-CM | POA: Diagnosis not present

## 2015-10-16 DIAGNOSIS — Z9889 Other specified postprocedural states: Secondary | ICD-10-CM | POA: Diagnosis not present

## 2015-10-16 NOTE — Progress Notes (Signed)
Weekly rad txs rt breast 11/20  Completed, no skin changes, still has steri strips on breast, using radiaplex bid, appetite good, twinges occasionally in right breast BP 144/71 mmHg  Pulse 86  Temp(Src) 98.4 F (36.9 C) (Oral)  Resp 16  Wt 100 lb 6.4 oz (45.541 kg)  Wt Readings from Last 3 Encounters:  10/16/15 100 lb 6.4 oz (45.541 kg)  10/09/15 101 lb 4.8 oz (45.949 kg)  10/02/15 100 lb 12.8 oz (45.723 kg)   10:55 AM

## 2015-10-16 NOTE — Progress Notes (Signed)
Department of Radiation Oncology  Phone:  [phone] Fax:        [phone]  Weekly Treatment Note    Name: Kelsey Fuller Date: 10/16/2015 MRN: [id number] DOB: 04/10/1941   Diagnosis:     ICD-9-CM ICD-10-CM   1. 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 acetaminophen (TYLENOL) 325 MG tablet Take 650 mg by mouth every 6 (six) hours as needed. Reported on 08/20/2015    . aspirin 81 MG tablet Take 81 mg by mouth daily.    . Chlorphen-Phenyleph-ASA (ALKA-SELTZER PLUS COLD PO) Take 2 capsules by mouth every 4 (four) hours as needed.    . cholecalciferol (VITAMIN D) 1000 units tablet Take 1,000 Units by mouth daily.    . fluticasone (FLOVENT HFA) 110 MCG/ACT inhaler Inhale 1 puff into the lungs 2 (two) times daily. Reported on 10/02/2015    . hyaluronate sodium (RADIAPLEXRX) GEL Apply 1 application topically 2 (two) times daily.    Marland Kitchen HYDROcodone-acetaminophen (NORCO/VICODIN) 5-325 MG tablet Take 1-2 tablets by mouth every 4 (four) hours as needed for moderate pain or severe pain. 30 tablet 0  . ibuprofen (ADVIL,MOTRIN) 200 MG tablet Take 600 mg by mouth every 8 (eight) hours as needed for moderate pain.    Marland Kitchen latanoprost (XALATAN) 0.005 % ophthalmic solution Place 1 drop into both eyes at bedtime.   3  . meloxicam (MOBIC) 15 MG tablet Take 15 mg by mouth daily as needed for pain.    . Multiple Vitamins-Minerals (ALIVE WOMENS 50+ PO) Take 1 tablet by mouth daily.    . non-metallic deodorant Jethro Poling) MISC Apply 1 application topically daily as needed.    Marland Kitchen SPIRIVA HANDIHALER 18 MCG inhalation capsule PLACE 1 CAPSULE INTO INHALER AND INHALE THE CONTENTS DAILY 90 capsule 0   No current facility-administered medications for this encounter.     ALLERGIES: Codeine; Penicillins; Sulfa antibiotics; and Fish  allergy   LABORATORY DATA:  Lab Results  Component Value Date   WBC 9.0 08/26/2015   HGB 13.9 08/26/2015   HCT 41.8 08/26/2015   MCV 87.6 08/26/2015   PLT 372 08/26/2015   Lab Results  Component Value Date   NA 141 08/12/2015   K 4.0 08/12/2015   CL 104 10/29/2014   CO2 26 08/12/2015   Lab Results  Component Value Date   ALT 16 08/12/2015   AST 18 08/12/2015   ALKPHOS 92 08/12/2015   BILITOT 0.90 08/12/2015     NARRATIVE: Kelsey Fuller was seen today for weekly treatment management. The chart was checked and the patient's films were reviewed.  Weekly rad txs rt breast 11/20 Completed. No skin changes. Still has steri strips on breast. Using radiaplex bid. Appetite good. Twinges occasionally in right breast. States, "The skin feels okay using the cream". Her energy level is okay, if she finds she is tired she just sits down or takes a nap.   BP 144/71 mmHg  Pulse 86  Temp(Src) 98.4 F (36.9 C) (Oral)  Resp 16  Wt 100 lb 6.4 oz (45.541 kg)  Wt Readings from Last 3 Encounters:  10/16/15 100 lb 6.4 oz (45.541 kg)  10/09/15 101 lb 4.8 oz (45.949 kg)  10/02/15 100 lb 12.8 oz (45.723 kg)  BP 144/71 mmHg  Pulse 86  Temp(Src) 98.4 F (36.9 C) (Oral)  Resp 16  Wt 100 lb 6.4 oz (45.541 kg)  PHYSICAL EXAMINATION: weight is 100 lb 6.4 oz (45.541 kg). Her oral temperature is 98.4 F (36.9 C). Her blood pressure is 144/71 and her pulse is 86. Her respiration is 16.      Minimal hyperpigmentation present.  ASSESSMENT: The patient is doing satisfactorily with treatment.  PLAN: We will continue with the patient's radiation treatment as planned.     ------------------------------------------------  Jodelle Gross, MD, PhD  This document serves as a record of services personally performed by Kyung Rudd, MD. It was created on his behalf by Arlyce Harman, a trained medical scribe. The creation of this record is based on the scribe's personal observations and the provider's  statements to them. This document has been checked and approved by the attending provider.

## 2015-10-19 ENCOUNTER — Ambulatory Visit
Admission: RE | Admit: 2015-10-19 | Discharge: 2015-10-19 | Disposition: A | Discharge status: Home / Self Care | Payer: Medicare Other | Source: Ambulatory Visit | Attending: Radiation Oncology | Admitting: Radiation Oncology

## 2015-10-19 DIAGNOSIS — Z510 Encounter for antineoplastic radiation therapy: Secondary | ICD-10-CM | POA: Diagnosis not present

## 2015-10-19 DIAGNOSIS — C50211 Malignant neoplasm of upper-inner quadrant of right female breast: Secondary | ICD-10-CM | POA: Diagnosis not present

## 2015-10-19 DIAGNOSIS — Z9889 Other specified postprocedural states: Secondary | ICD-10-CM | POA: Diagnosis not present

## 2015-10-20 ENCOUNTER — Ambulatory Visit
Admission: RE | Admit: 2015-10-20 | Discharge: 2015-10-20 | Disposition: A | Discharge status: Home / Self Care | Payer: Medicare Other | Source: Ambulatory Visit | Attending: Radiation Oncology | Admitting: Radiation Oncology

## 2015-10-20 DIAGNOSIS — Z510 Encounter for antineoplastic radiation therapy: Secondary | ICD-10-CM | POA: Diagnosis not present

## 2015-10-20 DIAGNOSIS — C50211 Malignant neoplasm of upper-inner quadrant of right female breast: Secondary | ICD-10-CM | POA: Diagnosis not present

## 2015-10-20 DIAGNOSIS — Z9889 Other specified postprocedural states: Secondary | ICD-10-CM | POA: Diagnosis not present

## 2015-10-21 ENCOUNTER — Ambulatory Visit
Admission: RE | Admit: 2015-10-21 | Discharge: 2015-10-21 | Disposition: A | Discharge status: Home / Self Care | Payer: Medicare Other | Source: Ambulatory Visit | Attending: Radiation Oncology | Admitting: Radiation Oncology

## 2015-10-21 DIAGNOSIS — Z510 Encounter for antineoplastic radiation therapy: Secondary | ICD-10-CM | POA: Diagnosis not present

## 2015-10-21 DIAGNOSIS — Z9889 Other specified postprocedural states: Secondary | ICD-10-CM | POA: Diagnosis not present

## 2015-10-21 DIAGNOSIS — C50211 Malignant neoplasm of upper-inner quadrant of right female breast: Secondary | ICD-10-CM | POA: Diagnosis not present

## 2015-10-22 ENCOUNTER — Ambulatory Visit
Admission: RE | Admit: 2015-10-22 | Discharge: 2015-10-22 | Disposition: A | Discharge status: Home / Self Care | Payer: Medicare Other | Source: Ambulatory Visit | Attending: Radiation Oncology | Admitting: Radiation Oncology

## 2015-10-22 DIAGNOSIS — C50211 Malignant neoplasm of upper-inner quadrant of right female breast: Secondary | ICD-10-CM | POA: Diagnosis not present

## 2015-10-22 DIAGNOSIS — Z9889 Other specified postprocedural states: Secondary | ICD-10-CM | POA: Diagnosis not present

## 2015-10-22 DIAGNOSIS — Z510 Encounter for antineoplastic radiation therapy: Secondary | ICD-10-CM | POA: Diagnosis not present

## 2015-10-23 ENCOUNTER — Ambulatory Visit
Admission: RE | Admit: 2015-10-23 | Discharge: 2015-10-23 | Disposition: A | Discharge status: Home / Self Care | Payer: Medicare Other | Source: Ambulatory Visit | Attending: Radiation Oncology | Admitting: Radiation Oncology

## 2015-10-23 DIAGNOSIS — C50211 Malignant neoplasm of upper-inner quadrant of right female breast: Secondary | ICD-10-CM | POA: Diagnosis not present

## 2015-10-23 DIAGNOSIS — Z9889 Other specified postprocedural states: Secondary | ICD-10-CM | POA: Diagnosis not present

## 2015-10-23 DIAGNOSIS — Z510 Encounter for antineoplastic radiation therapy: Secondary | ICD-10-CM | POA: Diagnosis not present

## 2015-10-23 NOTE — Progress Notes (Signed)
Patient seen in the back by MD Not sent to nursing for assessment 10:46 AM

## 2015-10-24 ENCOUNTER — Encounter: Payer: Self-pay | Admitting: Radiation Oncology

## 2015-10-24 NOTE — Progress Notes (Signed)
   Department of Radiation Oncology  Phone:  [phone] Fax:        [phone]  Weekly Treatment Note    Name: Kelsey Fuller Date: 10/24/2015 MRN: [id number] DOB: 1941-03-16   Diagnosis:     ICD-9-CM ICD-10-CM   1. 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 acetaminophen (TYLENOL) 325 MG tablet Take 650 mg by mouth every 6 (six) hours as needed. Reported on 08/20/2015    . aspirin 81 MG tablet Take 81 mg by mouth daily.    . Chlorphen-Phenyleph-ASA (ALKA-SELTZER PLUS COLD PO) Take 2 capsules by mouth every 4 (four) hours as needed.    . cholecalciferol (VITAMIN D) 1000 units tablet Take 1,000 Units by mouth daily.    . fluticasone (FLOVENT HFA) 110 MCG/ACT inhaler Inhale 1 puff into the lungs 2 (two) times daily. Reported on 10/02/2015    . hyaluronate sodium (RADIAPLEXRX) GEL Apply 1 application topically 2 (two) times daily.    Marland Kitchen HYDROcodone-acetaminophen (NORCO/VICODIN) 5-325 MG tablet Take 1-2 tablets by mouth every 4 (four) hours as needed for moderate pain or severe pain. 30 tablet 0  . ibuprofen (ADVIL,MOTRIN) 200 MG tablet Take 600 mg by mouth every 8 (eight) hours as needed for moderate pain.    Marland Kitchen latanoprost (XALATAN) 0.005 % ophthalmic solution Place 1 drop into both eyes at bedtime.   3  . meloxicam (MOBIC) 15 MG tablet Take 15 mg by mouth daily as needed for pain.    . Multiple Vitamins-Minerals (ALIVE WOMENS 50+ PO) Take 1 tablet by mouth daily.    . non-metallic deodorant Jethro Poling) MISC Apply 1 application topically daily as needed.    Marland Kitchen SPIRIVA HANDIHALER 18 MCG inhalation capsule PLACE 1 CAPSULE INTO INHALER AND INHALE THE CONTENTS DAILY 90 capsule 0   No current facility-administered medications for this encounter.     ALLERGIES: Codeine; Penicillins; Sulfa antibiotics; and Fish  allergy   LABORATORY DATA:  Lab Results  Component Value Date   WBC 9.0 08/26/2015   HGB 13.9 08/26/2015   HCT 41.8 08/26/2015   MCV 87.6 08/26/2015   PLT 372 08/26/2015   Lab Results  Component Value Date   NA 141 08/12/2015   K 4.0 08/12/2015   CL 104 10/29/2014   CO2 26 08/12/2015   Lab Results  Component Value Date   ALT 16 08/12/2015   AST 18 08/12/2015   ALKPHOS 92 08/12/2015   BILITOT 0.90 08/12/2015     NARRATIVE: Kelsey Fuller was seen today for weekly treatment management. The chart was checked and the patient's films were reviewed.  The patient continues to do very well. Her electron boost field was set up today. The patient states that she has not had any significant problems over the last week.  PHYSICAL EXAMINATION: vitals were not taken for this visit.     The patient's skin shows mild radiation effect. No desquamation.  ASSESSMENT: The patient is doing satisfactorily with treatment.  PLAN: We will continue with the patient's radiation treatment as planned. She is doing excellent.

## 2015-10-26 ENCOUNTER — Ambulatory Visit
Admission: RE | Admit: 2015-10-26 | Discharge: 2015-10-26 | Disposition: A | Discharge status: Home / Self Care | Payer: Medicare Other | Source: Ambulatory Visit | Attending: Radiation Oncology | Admitting: Radiation Oncology

## 2015-10-26 DIAGNOSIS — Z9889 Other specified postprocedural states: Secondary | ICD-10-CM | POA: Diagnosis not present

## 2015-10-26 DIAGNOSIS — C50211 Malignant neoplasm of upper-inner quadrant of right female breast: Secondary | ICD-10-CM | POA: Diagnosis not present

## 2015-10-26 DIAGNOSIS — Z510 Encounter for antineoplastic radiation therapy: Secondary | ICD-10-CM | POA: Diagnosis not present

## 2015-10-27 ENCOUNTER — Ambulatory Visit
Admission: RE | Admit: 2015-10-27 | Discharge: 2015-10-27 | Disposition: A | Discharge status: Home / Self Care | Payer: Medicare Other | Source: Ambulatory Visit | Attending: Radiation Oncology | Admitting: Radiation Oncology

## 2015-10-27 DIAGNOSIS — Z9889 Other specified postprocedural states: Secondary | ICD-10-CM | POA: Diagnosis not present

## 2015-10-27 DIAGNOSIS — C50211 Malignant neoplasm of upper-inner quadrant of right female breast: Secondary | ICD-10-CM | POA: Diagnosis not present

## 2015-10-27 DIAGNOSIS — Z510 Encounter for antineoplastic radiation therapy: Secondary | ICD-10-CM | POA: Diagnosis not present

## 2015-10-28 ENCOUNTER — Ambulatory Visit
Admission: RE | Admit: 2015-10-28 | Discharge: 2015-10-28 | Disposition: A | Discharge status: Home / Self Care | Payer: Medicare Other | Source: Ambulatory Visit | Attending: Radiation Oncology | Admitting: Radiation Oncology

## 2015-10-28 DIAGNOSIS — Z9889 Other specified postprocedural states: Secondary | ICD-10-CM | POA: Diagnosis not present

## 2015-10-28 DIAGNOSIS — Z510 Encounter for antineoplastic radiation therapy: Secondary | ICD-10-CM | POA: Diagnosis not present

## 2015-10-28 DIAGNOSIS — C50211 Malignant neoplasm of upper-inner quadrant of right female breast: Secondary | ICD-10-CM | POA: Diagnosis not present

## 2015-10-29 ENCOUNTER — Ambulatory Visit
Admission: RE | Admit: 2015-10-29 | Discharge: 2015-10-29 | Disposition: A | Discharge status: Home / Self Care | Payer: Medicare Other | Source: Ambulatory Visit | Attending: Radiation Oncology | Admitting: Radiation Oncology

## 2015-10-29 ENCOUNTER — Ambulatory Visit: Payer: Medicare Other

## 2015-10-29 ENCOUNTER — Telehealth: Payer: Self-pay | Admitting: *Deleted

## 2015-10-29 NOTE — Telephone Encounter (Signed)
  Oncology Nurse Navigator Documentation  Navigator Location: [facility] (10/29/15 1200) Navigator Encounter Type: Telephone (10/29/15 1200) Telephone: Outgoing Call (10/29/15 1200)         Patient Visit Type: RadOnc (10/29/15 1200) Treatment Phase: Final Radiation Tx (10/29/15 1200)                            Time Spent with Patient: 15 (10/29/15 1200)

## 2015-10-30 ENCOUNTER — Inpatient Hospital Stay: Admission: RE | Admit: 2015-10-30 | Payer: Medicare Other | Source: Ambulatory Visit | Admitting: Radiation Oncology

## 2015-10-30 ENCOUNTER — Ambulatory Visit
Admission: RE | Admit: 2015-10-30 | Discharge: 2015-10-30 | Disposition: A | Discharge status: Home / Self Care | Payer: Medicare Other | Source: Ambulatory Visit | Attending: Radiation Oncology | Admitting: Radiation Oncology

## 2015-10-30 ENCOUNTER — Encounter: Payer: Self-pay | Admitting: Radiation Oncology

## 2015-10-30 ENCOUNTER — Encounter: Payer: Self-pay | Admitting: *Deleted

## 2015-10-30 DIAGNOSIS — C50211 Malignant neoplasm of upper-inner quadrant of right female breast: Secondary | ICD-10-CM

## 2015-10-30 DIAGNOSIS — Z510 Encounter for antineoplastic radiation therapy: Secondary | ICD-10-CM | POA: Diagnosis not present

## 2015-10-30 DIAGNOSIS — Z9889 Other specified postprocedural states: Secondary | ICD-10-CM | POA: Diagnosis not present

## 2015-10-30 MED ORDER — RADIAPLEXRX EX GEL
Freq: Once | CUTANEOUS | Status: AC
  Administered 2015-10-30: 11:00:00 via TOPICAL

## 2015-10-30 NOTE — Progress Notes (Signed)
Ms. Prosperi completed today.  Reviewed inframmary fold on the right breast and note dry, reddened, dry desquamation in the right inframmary fold with mild tenderness.  Suggested she leave her bra off, as much as possible to prevent abrading of this area.  Given Radiaplex Gel to use on anterior breast and to to avoid usage in the inframmary fold if this region has a moist appearance. Given instructions to call if the desquamated area becomes painful or she has excessive drainage.  She and family stated understanding.  Last Treatment today.

## 2015-11-11 ENCOUNTER — Other Ambulatory Visit: Payer: Self-pay | Admitting: *Deleted

## 2015-11-11 DIAGNOSIS — H401131 Primary open-angle glaucoma, bilateral, mild stage: Secondary | ICD-10-CM | POA: Diagnosis not present

## 2015-11-11 DIAGNOSIS — H26492 Other secondary cataract, left eye: Secondary | ICD-10-CM | POA: Diagnosis not present

## 2015-11-11 DIAGNOSIS — C50211 Malignant neoplasm of upper-inner quadrant of right female breast: Secondary | ICD-10-CM

## 2015-11-12 ENCOUNTER — Telehealth: Payer: Self-pay | Admitting: Hematology and Oncology

## 2015-11-12 NOTE — Telephone Encounter (Signed)
cld pt and left message of time & date of appt for 10/5 @10 

## 2015-11-26 NOTE — Progress Notes (Incomplete)
°  Radiation Oncology         [phone] ________________________________  Name: Kelsey Fuller MRN: [id number]  Date: 10/30/2015  DOB: 31-Jul-1940  End of Treatment Note  Diagnosis:      ICD-9-CM ICD-10-CM   1. 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 The Right breast was treated to 42.5 Gy in 17 fractions at 2.5 Gy per fraction. 2. The Right breast was boosted to 7.5 Gy in 3 fractions at 2.5 Gy per fraction.   Beams/energy:    1. 3D // 6X 2. En face // 9 MeV  Narrative: The patient tolerated radiation treatment relatively well.  The patient did not have any significant problems with treatment.   Plan: The patient has completed radiation treatment. The patient will return to radiation oncology clinic for routine followup in one month. I advised them to call or return sooner if they have any questions or concerns related to their recovery or treatment.  ------------------------------------------------  Jodelle Gross, MD, PhD  This document serves as a record of services personally performed by Kyung Rudd, MD. It was created on his behalf by Arlyce Harman, a trained medical scribe. The creation of this record is based on the scribe's personal observations and the provider's statements to them. This document has been checked and approved by the attending provider.

## 2015-11-30 NOTE — Addendum Note (Signed)
Encounter addended by: Kyung Rudd, MD on: 11/30/2015  8:45 PM<BR>    Actions taken: Visit diagnoses modified, Sign clinical note

## 2015-11-30 NOTE — Progress Notes (Signed)
  Radiation Oncology         [phone] ________________________________  Name: Kelsey Fuller MRN: [id number]  Date: 10/09/2015  DOB: 04-23-1940  Complex simulation note  The patient has undergone complex simulation for her upcoming boost treatment for her diagnosis of breast cancer. The patient has initially been planned to receive 42.5 Gy. The patient will now receive a 7.5 Gy boost to the seroma cavity which has been contoured. This will be accomplished using an en face electron field. Based on the depth of the target area, 9 MeV electrons will be used. The patient's final total dose therefore will be 50 Gy. A complex isodose plan from the electron [facility] Carlo calculation is requested for the boost treatment.   _______________________________  Jodelle Gross, MD, PhD

## 2015-11-30 NOTE — Progress Notes (Signed)
  Radiation Oncology         [phone] ________________________________  Name: Kelsey Fuller MRN: [id number]  Date: 09/23/2015  DOB: 01-16-1941   DIAGNOSIS:     ICD-9-CM ICD-10-CM   1. 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 The patient was placed in a supine position on a breast board. A customized vac-lock bag was constructed and this complex treatment device will be used on a daily basis during her treatment. In this fashion, a CT scan was obtained through the chest area and an isocenter was placed near the chest wall within the breast.  The patient will be planned to receive a course of radiation initially to a dose of 42.5 Gy. This will consist of a whole breast radiotherapy technique. To accomplish this, 2 customized blocks have been designed which will correspond to medial and lateral whole breast tangent fields. This treatment will be accomplished at 2.5 Gy per fraction. A forward planning technique will also be evaluated to determine if this approach improves the plan. It is anticipated that the patient will then receive a 7.5 Gy boost to the seroma cavity which has been contoured. This will be accomplished at 2.5 Gy per fraction.   This initial treatment will consist of a 3-D conformal technique. The seroma has been contoured as the primary target structure. Additionally, dose volume histograms of both this target as well as the lungs and heart will also be evaluated. Such an approach is necessary to ensure that the target area is adequately covered while the nearby critical  normal structures are adequately spared.  Plan:  The final anticipated total dose therefore will correspond to 50 Gy.    _______________________________   Jodelle Gross, MD, PhD

## 2015-11-30 NOTE — Progress Notes (Signed)
  Radiation Oncology         [phone] ________________________________  Name: Kelsey Fuller MRN: [id number]  Date: 09/23/2015  DOB: September 14, 1940  Optical Surface Tracking Plan:  Since intensity modulated radiotherapy (IMRT) and 3D conformal radiation treatment methods are predicated on accurate and precise positioning for treatment, intrafraction motion monitoring is medically necessary to ensure accurate and safe treatment delivery.  The ability to quantify intrafraction motion without excessive ionizing radiation dose can only be performed with optical surface tracking. Accordingly, surface imaging offers the opportunity to obtain 3D measurements of patient position throughout IMRT and 3D treatments without excessive radiation exposure.  I am ordering optical surface tracking for this patient's upcoming course of radiotherapy. ________________________________  Kyung Rudd, MD 11/30/2015 8:45 PM    Reference:   Particia Jasper, et al. Surface imaging-based analysis of intrafraction motion for breast radiotherapy patients.Journal of [place], n. 6, nov. 2014. ISSN [id number].   Available at: <http://www.jacmp.org/index.php/jacmp/article/view/4957>.

## 2015-12-08 ENCOUNTER — Ambulatory Visit: Payer: Self-pay | Admitting: Radiation Oncology

## 2015-12-08 NOTE — Progress Notes (Signed)
SPIROMETRY TODAY; Call Dr. Lorie Apley office for last colonoscopy  Chief Complaint  Patient presents with  . Medicare Wellness    nonfasting med check plus/AWV. Sometimes she has a tightness in her chest, if she takes 2 baby ASA it does seem to help.     Kelsey Fuller is a 75 y.o. female who presents for annual wellness visit and follow-up on chronic medical conditions.  She has the following concerns:  She sometimes gets a tightness in her chest.  Gaviscon or Alka Selzer doesn't seem to help.  She once took 2 baby aspirin and it eased off.  It was across her whole chest, radiated around to both sides, and a little into her posterior neck.  This occurs sporadically, one every other month.  Had slight symptoms last night, and it resolved on its own (she had taken aspirin in the morning).  She sat up, moved around, moved her arms around, and felt better. She sees Dr. Acie Fredrickson yearly, hasn't scheduled her appointment yet this year. Review of chart shows she hasn't been seen in 2 years.  Osteoporosis:  She was seen in March for discussion of osteoporosis and treatments.  This was prior to her diagnosis of breast cancer. At that time we discussed Evista and Prolia in detail, as well as bisophosphonates.  Likely it was a bisphosphonate that she didn't tolerate in the past.  She was concerned about possibility of worsening leg cramps and recurrence of headaches with Evista. We discussed potential side effects and risks of Prolia and evista at length. She was given info, and was to let us know her decision.  She was diagnosed with the breast cancer shortly thereafter and this hasn't been readdressed yet. She had mildly low vitamin D in 10/2014  She also has had issues with back pain in March, treated with NSAIDs, and after seeing Vickie in April, was referred for PT. She continues to have problems with low back pain which radiates down the right leg.  She reports not getting any benefit from the physical therapy.   Pain radiates down to her ankle, even just with drying her hair.  Pain is worse with standing (and she dries her hair standing up). Pain levels fluctuate.  She has been taking tylenol, which is helpful.  Pain can get up to a 10.  Currently she has no pain. She had x-rays in April. 2017 which showed: Diffuse osteopenia degenerative change. 2 mm anterolisthesis L3 on L4 and L4 on L5.No acute bony abnormality identified. No evidence of fracture.  COPD: Breathing is good. She uses Spiriva daily without side effects.  In February, she reported that taking trash cans up the hill caused some shortness of breath, relieved by 5 minutes of rest. She reported at that time only needing the albuterol very sporadically (ie in the mountains). Spirometry in 05/2015 showed moderately severe obstruction.  Arnuity 138mcg was added to her Spiriva at that time, as she had 3 months of spiriva (had just gotten it filled). This was changed to Flovent due to insurance reasons.  She is using 2 puffs once daily (rather than 1 puff BID as prescribed).  Denies thrush or side effects. She was supposed to f/u in 3 mos for repeat spirometry and possibly changing to a combination medication.  In the interim, however, she was diagnosed with breast cancer and has been undergoing radiation treatment. She has been using the Flovent 2 puffs once daily along with spiriva daily.  She feels like this  is helping, gets less short of breath going up hills (no longer has to sit to catch her breath, but does report slight shortness of breath).  She hasn't needed to use the albuterol since the trip to the mountains last winter.  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 She recently completed XRT, and the plan is to start antiestrogen treatment with anastrozole 1 mg daily 5 years.  She has upcoming appointment with oncologist to discuss this.   Immunization  History  Administered Date(s) Administered  . Influenza, High Dose Seasonal PF 01/26/2015  . Tdap 06/04/2015   She recalls getting one pneumonia vaccine through a pharmacy, doesn't recall which type of vaccine. She believes this was likely over 4-5 years ago. She knows that she has had a shingles vaccine Last Pap smear: 10/2014, normal, with no high risk HPV Last mammogram: 07/2015 Last colonoscopy: with Dr. Collene Mares about 5 years ago.  Denies having polyps.  She thinks she may be due next year. Last DEXA: 06/2015--osteoporosis Dentist: has upper and lower dentures; hasn't been to the dentist in many years. She plans to go. Ophtho: every 3 months (for glaucoma) Exercise: yardwork, otherwise no regular exercise.  She used to walk a lot.  Other doctors caring for patient include: Ophtho:  Dr. Delman Cheadle GI: Dr. Collene Mares Cardiologist:  Dr. Acie Fredrickson (sees yearly since her sister passed away). Dermatologist (hasn't seen in many years): Dr. Denna Haggard. Oncologist: Dr. Lindi Adie Radiation oncology: Dr. Lisbeth Renshaw Surgeon: Dr. Barry Dienes  Depression, Fall and Functional Screens all in epic and entirely negative.  End of Life Discussion:  Patient does not have a living will and medical power of attorney. She was given paperwork last year, but hasn't discussed with her sons yet.  Past Medical History:  Diagnosis Date  . Allergy   . Arthritis   . Asthma    (pt denies)  . 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 Breast cancer of upper-inner quadrant of right female breast ([place]) 08/07/2015  . Chronic headaches    (resolved)  . Complication of anesthesia   . COPD (chronic obstructive pulmonary disease) ([place])   . Glaucoma   . Leg pain   . Osteoporosis   . PONV (postoperative nausea and vomiting)   . Shortness of breath on exertion   . Thrombophlebitis     Past Surgical History:  Procedure Laterality Date  . APPENDECTOMY    . BREAST LUMPECTOMY WITH RADIOACTIVE SEED  LOCALIZATION Right 09/01/2015   Procedure: RADIOACTIVE SEED GUIDED RIGHT BREAST LUMPECTOMY;  Surgeon: Stark Klein, MD;  Location: [facility];  Service: General;  Laterality: Right;  . BREAST SURGERY    . TONSILLECTOMY    . VARICOSE VEIN SURGERY      Social History   Social History  . Marital status: Married    Spouse name: N/A  . Number of children: N/A  . Years of education: N/A   Occupational History  . Not on file.   Social History Main Topics  . Smoking status: Former Smoker    Packs/day: 0.50    Years: 20.00    Types: Cigarettes    Quit date: 04/12/1975  . Smokeless tobacco: Never Used  . Alcohol use 0.6 oz/week    1 Standard drinks or equivalent per week     Comment: 1 glass of wine (very small glass) 3x/week  .  Drug use: No  . Sexual activity: Not Currently   Other Topics Concern  . Not on file   Social History Narrative   Lives alone.  Widowed in 07/2014.  3 sons, 3 grandchildren, all locally. Originally from [place], [place] ([place]). Retired Psychiatric nurse. Volunteers at [employer] History  Problem Relation Age of Onset  . Diabetes Mother   . Heart disease Mother     congestive heart failure  . Hypertension Mother   . Dementia Mother   . Heart disease Father     rheumatic heart disease  . Deep vein thrombosis Son   . Deep vein thrombosis Maternal Grandfather   . Pulmonary embolism Son     after a boating accident, long drive; had DVT/PE  . Deep vein thrombosis Cousin   . Cancer Neg Hx     Outpatient Encounter Prescriptions as of 12/09/2015  Medication Sig Note  . acetaminophen (TYLENOL) 325 MG tablet Take 650 mg by mouth every 6 (six) hours as needed. Reported on 08/20/2015   . aspirin 81 MG tablet Take 81 mg by mouth daily.   . cholecalciferol (VITAMIN D) 1000 units tablet Take 1,000 Units by mouth daily.   . fluticasone (FLOVENT HFA) 110 MCG/ACT inhaler Inhale 1 puff into the lungs 2 (two) times daily. Reported on 10/02/2015 12/09/2015: Taking 2 puffs once  daily  . latanoprost (XALATAN) 0.005 % ophthalmic solution Place 1 drop into both eyes at bedtime.    . Multiple Vitamins-Minerals (ALIVE WOMENS 50+ PO) Take 1 tablet by mouth daily.   Marland Kitchen SPIRIVA HANDIHALER 18 MCG inhalation capsule PLACE 1 CAPSULE INTO INHALER AND INHALE THE CONTENTS DAILY   . Chlorphen-Phenyleph-ASA (ALKA-SELTZER PLUS COLD PO) Take 2 capsules by mouth every 4 (four) hours as needed.   Marland Kitchen ibuprofen (ADVIL,MOTRIN) 200 MG tablet Take 600 mg by mouth every 8 (eight) hours as needed for moderate pain.   . [DISCONTINUED] hyaluronate sodium (RADIAPLEXRX) GEL Apply 1 application topically 2 (two) times daily.   . [DISCONTINUED] HYDROcodone-acetaminophen (NORCO/VICODIN) 5-325 MG tablet Take 1-2 tablets by mouth every 4 (four) hours as needed for moderate pain or severe pain.   . [DISCONTINUED] meloxicam (MOBIC) 15 MG tablet Take 15 mg by mouth daily as needed for pain.   . [DISCONTINUED] non-metallic deodorant Jethro Poling) MISC Apply 1 application topically daily as needed.    No facility-administered encounter medications on file as of 12/09/2015.     Allergies  Allergen Reactions  . Codeine Other (See Comments)  . Penicillins Other (See Comments)  . Sulfa Antibiotics Other (See Comments)    Told as a child by her mother never to take  . Fish Allergy Diarrhea    Scaled fish- when pregnant ate some recently no problem   ROS:  Denies fever, chills, URI symptoms, cough. She denies depression. She denies vision changes, decreased hearing, ear pain, sore throat, breast concerns, chest pain, palpitations, dizziness, syncope,cough, swelling, nausea, vomiting, diarrhea, constipation, abdominal pain, melena, hematochezia, indigestion/heartburn, hematuria, incontinence, dysuria, vaginal bleeding, discharge, odor or itch, genital lesions, joint pains (just the right low back pain and sciatica); has some hand arthiritis/stiffness; no numbness, tingling, weakness, tremor, suspicious skin lesions,  depression, anxiety, abnormal bleeding/bruising, or enlarged lymph nodes. Rare headaches. SK's--many, some have been removed and recurred. No significant/recent changes. Weight-has been stable, gained some. See HPI re: chest pain and shortness of breath/COPD.   PHYSICAL EXAM:  BP 110/70 (BP Location: Left Arm, Patient Position: Sitting, Cuff Size: Normal)  Pulse 84   Ht 5\' 3"  (1.6 m)   Wt 103 lb (46.7 kg)   BMI 18.25 kg/m    General Appearance:    Alert, cooperative, no distress, appears stated age  Head:    Normocephalic, without obvious abnormality, atraumatic  Eyes:    PERRL, conjunctiva/corneas clear, EOM's intact, fundi    benign  Ears:    Normal TM's and external ear canals  Nose:   Nares normal, mucosa normal, no drainage or sinus   tenderness  Throat:   Lips, mucosa, and tongue normal; upper and lower dentures.  Neck:   Supple, no lymphadenopathy;  thyroid:  no   enlargement/tenderness/nodules; no carotid   bruit or JVD  Back:    Spine nontender, no curvature, ROM normal, no CVA     tenderness. Tender at right sciatic notch. Normal pyriformis ROM.  Lungs:     Clear to auscultation bilaterally without wheezes, rales or ronchi; respirations unlabored.  Had slight crackles noted at first at L base, but cleared with deep breaths, just slightly course.  No wheezes, ronchi.  Chest Wall:    No tenderness or deformity   Heart:    Regular rate and rhythm, S1 and S2 normal, no murmur, rub or gallop. Frequent ectopy (after every 3rd beat).  Breast Exam:    No tenderness, masses, or nipple discharge or inversion.   No axillary lymphadenopathy. Fibroglandular changes bilaterally, R>L.  WHSS right medial breast, and tattoo's noted from radiation.  Abdomen:     Soft, non-tender, nondistended, normoactive bowel sounds,    no masses, no hepatosplenomegaly  Genitalia:    Normal external genitalia without lesions. Atrophic changes noted.  BUS and vagina normal; no cervical motion tenderness.  No abnormal vaginal discharge.  Uterus and adnexa not enlarged, nontender, no masses.  Pap not performed  Rectal:    Normal tone, no masses or tenderness; guaiac negative stool  Extremities:   No clubbing, cyanosis or edema. Onychyomycosis. All toes on the right, only on the 4th on the left. Dry, calloused feet; blue discoloration when sitting, improved quicly when legs elevated (supine position)  Pulses:   2+ and symmetric all extremities  Skin:   Skin color, texture, turgor normal, no rashes or lesions  Lymph nodes:   Cervical, supraclavicular, and axillary nodes normal  Neurologic:   CNII-XII intact, normal strength, sensation and gait; reflexes 2+ and symmetric throughout. Negative straight leg raise                  Psych:   Normal mood, affect, hygiene and grooming   ASSESSMENT/PLAN:   Vit D, TSH, lipid (atherosclerosis) Lab Results  Component Value Date   TSH 1.621 10/29/2014  not covered, and no symptoms.   Spirometry--moderately severe obstruction.  Only minimally improved, if at all, from last check, prior to being on inhaled steroids. Increase to 220 BID. Continue spiriva  prevnar-13, high dose flu shot  EKG--NSR, rate 70, with PAC/PVC's noted.  Pt past due to see Dr. Acie Fredrickson, and plans to schedule. I suspect chest discomfort is related to COPD.  Osteoporosis--recommend discussing with Dr. Lindi Adie, who I'm sure will agree with starting treatment, especially given the increased bone loss that can be expected with the antiestrogen medications that will be started. (send copy of note).  LBP with radiculopathy: Refer for MRI, and likely will need neurosurg consult after.  Abnormal findings noted just on plain films.   Discussed monthly self breast exams and yearly mammograms; at least 30  minutes of aerobic activity at least 5 days/week and weight-bearing exercise 2x/week; proper sunscreen use reviewed; healthy diet, including goals of calcium and vitamin D intake and alcohol  recommendations (less than or equal to 1 drink/day) reviewed; regular seatbelt use; changing batteries in smoke detectors.  Immunization recommendations discussed--high dose flu shot today; needs another pneumonia vaccine-- Unsure if she had pneumovax or prevnar-13, needs the other. Given the timing of when she got the shot at the pharmacy (4-5 years ago), it wa likely pneumovax, so prevnar-13 given today.  Colonoscopy recommendations reviewed--check with Dr. Collene Mares to see the date of last one, and when she is due again.  Consider Cologard if low risk.  Given forms for Living Will and Healthcare power of attorney, and advised her to get [place] copies when completed. Encouraged her to discuss with her sons.  Discussed her wishes--Full Code, Full care.    Medicare Attestation I have personally reviewed: The patient's medical and social history Their use of alcohol, tobacco or illicit drugs Their current medications and supplements The patient's functional ability including ADLs,fall risks, home safety risks, cognitive, and hearing and visual impairment Diet and physical activities Evidence for depression or mood disorders  The patient's weight, height, and BMI have been recorded in the chart.  I have made referrals, counseling, and provided education to the patient based on review of the above and I have provided the patient with a written personalized care plan for preventive services.     Cavion Faiola A, MD   12/08/2015

## 2015-12-09 ENCOUNTER — Ambulatory Visit (INDEPENDENT_AMBULATORY_CARE_PROVIDER_SITE_OTHER): Payer: Medicare Other | Admitting: Family Medicine

## 2015-12-09 ENCOUNTER — Encounter: Payer: Self-pay | Admitting: Family Medicine

## 2015-12-09 VITALS — BP 110/70 | HR 84 | Ht 63.0 in | Wt 103.0 lb

## 2015-12-09 DIAGNOSIS — Z01419 Encounter for gynecological examination (general) (routine) without abnormal findings: Secondary | ICD-10-CM | POA: Diagnosis not present

## 2015-12-09 DIAGNOSIS — I700 Atherosclerosis of aorta: Secondary | ICD-10-CM | POA: Diagnosis not present

## 2015-12-09 DIAGNOSIS — M5441 Lumbago with sciatica, right side: Secondary | ICD-10-CM | POA: Diagnosis not present

## 2015-12-09 DIAGNOSIS — E559 Vitamin D deficiency, unspecified: Secondary | ICD-10-CM | POA: Diagnosis not present

## 2015-12-09 DIAGNOSIS — R079 Chest pain, unspecified: Secondary | ICD-10-CM

## 2015-12-09 DIAGNOSIS — C50211 Malignant neoplasm of upper-inner quadrant of right female breast: Secondary | ICD-10-CM | POA: Diagnosis not present

## 2015-12-09 DIAGNOSIS — J449 Chronic obstructive pulmonary disease, unspecified: Secondary | ICD-10-CM

## 2015-12-09 DIAGNOSIS — R636 Underweight: Secondary | ICD-10-CM | POA: Diagnosis not present

## 2015-12-09 DIAGNOSIS — Z23 Encounter for immunization: Secondary | ICD-10-CM

## 2015-12-09 DIAGNOSIS — Z0000 Encounter for general adult medical examination without abnormal findings: Secondary | ICD-10-CM | POA: Diagnosis not present

## 2015-12-09 DIAGNOSIS — M810 Age-related osteoporosis without current pathological fracture: Secondary | ICD-10-CM | POA: Diagnosis not present

## 2015-12-09 LAB — LIPID PANEL
CHOLESTEROL: 193 mg/dL (ref 125–200)
HDL: 77 mg/dL (ref >=46)
LDL Cholesterol: 102 mg/dL (ref <130)
Total CHOL/HDL Ratio: 2.5 Ratio (ref <=5.0)
Triglycerides: 68 mg/dL (ref <150)
VLDL: 14 mg/dL (ref <30)

## 2015-12-09 NOTE — Patient Instructions (Addendum)
  HEALTH MAINTENANCE RECOMMENDATIONS:  It is recommended that you get at least 30 minutes of aerobic exercise at least 5 days/week (for weight loss, you may need as much as 60-90 minutes). This can be any activity that gets your heart rate up. This can be divided in 10-15 minute intervals if needed, but try and build up your endurance at least once a week.  Weight bearing exercise is also recommended twice weekly.  Eat a healthy diet with lots of vegetables, fruits and fiber.  "Colorful" foods have a lot of vitamins (ie green vegetables, tomatoes, red peppers, etc).  Limit sweet tea, regular sodas and alcoholic beverages, all of which has a lot of calories and sugar.  Up to 1 alcoholic drink daily may be beneficial for women (unless trying to lose weight, watch sugars).  Drink a lot of water.  Calcium recommendations are 1200-1500 mg daily (1500 mg for postmenopausal women or women without ovaries), and vitamin D 1000 IU daily.  This should be obtained from diet and/or supplements (vitamins), and calcium should not be taken all at once, but in divided doses.  Monthly self breast exams and yearly mammograms for women over the age of 43 is recommended.  Sunscreen of at least SPF 30 should be used on all sun-exposed parts of the skin when outside between the hours of 10 am and 4 pm (not just when at beach or pool, but even with exercise, golf, tennis, and yard work!)  Use a sunscreen that says "broad spectrum" so it covers both UVA and UVB rays, and make sure to reapply every 1-2 hours.  Remember to change the batteries in your smoke detectors when changing your clock times in the spring and fall.  Use your seat belt every time you are in a car, and please drive safely and not be distracted with cell phones and texting while driving.   Kelsey Fuller , Thank you for taking time to come for your Medicare Wellness Visit. I appreciate your ongoing commitment to your health goals. Please review the following  plan we discussed and let me know if I can assist you in the future.   These are the goals we discussed: Goals    None      This is a list of the screening recommended for you and due dates:  Health Maintenance  Topic Date Due  . Shingles Vaccine  05/03/1999  . Pneumonia vaccines (1 of 2 - PCV13) 05/02/2004  . Flu Shot  11/10/2015  . Tetanus Vaccine  06/03/2025  . DEXA scan (bone density measurement)  Completed    If you can get [place] an approximate date of your shingles vaccine, we can enter that date so it doesn't appear that you are past due. I'm aware that you have had the vaccine (it is only needed once).   We are giving you prevnar-13 today.  You likely had pneumovax at the pharmacy and are up to date.  We will likely give you a booster at some point in the future.  You will need follow up bone density tests (usually done every 2 years--I would wait 2 years after starting treatment).  Colon cancer screening--we are requesting records from Dr. Collene Mares. Sounds like you are up to date. Consider Cologard testing in the future, rather than colonoscopy, if you are considered low risk (no polyps). I will review Dr. Lorie Apley records, and this is something you can discuss with her as well.

## 2015-12-10 LAB — VITAMIN D 25 HYDROXY (VIT D DEFICIENCY, FRACTURES): VIT D 25 HYDROXY: 28 ng/mL — AB (ref 30–100)

## 2015-12-16 ENCOUNTER — Ambulatory Visit (HOSPITAL_BASED_OUTPATIENT_CLINIC_OR_DEPARTMENT_OTHER): Payer: Medicare Other | Admitting: Hematology and Oncology

## 2015-12-16 ENCOUNTER — Telehealth: Payer: Self-pay | Admitting: Hematology and Oncology

## 2015-12-16 ENCOUNTER — Encounter: Payer: Self-pay | Admitting: Hematology and Oncology

## 2015-12-16 DIAGNOSIS — M810 Age-related osteoporosis without current pathological fracture: Secondary | ICD-10-CM | POA: Diagnosis not present

## 2015-12-16 DIAGNOSIS — C50211 Malignant neoplasm of upper-inner quadrant of right female breast: Secondary | ICD-10-CM

## 2015-12-16 MED ORDER — ANASTROZOLE 1 MG PO TABS: 1.0000 mg | ORAL_TABLET | Freq: Every day | ORAL | 3 refills | Status: DC

## 2015-12-16 NOTE — Telephone Encounter (Signed)
appt made and avs printed °

## 2015-12-16 NOTE — Progress Notes (Signed)
Patient Care Team: Rita Ohara, MD as PCP - General (Family Medicine)  DIAGNOSIS: Breast cancer of upper-inner quadrant of right female breast [facility])   Staging form: Breast, AJCC 7th Edition   - Clinical stage from 08/12/2015: Stage 0 (Tis (DCIS), N0, M0) - Unsigned         Staging comments: Staged at breast conference on 5.3.17  SUMMARY OF ONCOLOGIC HISTORY:   Breast cancer of upper-inner quadrant of right female breast ([place])   08/06/2015 Mammogram    Right breast mammogram and ultrasound revealed asymmetry posteriorly 4 x 4 x 3 mm, axilla negative,Tis N0 stage 0      08/06/2015 Initial Diagnosis    Right breast biopsy 2:00 position goal DCIS with necrosis intermediate grade,ER 100%, PR 85%,?foci of early stromal invasion      09/01/2015 Surgery    Right lumpectomy: IDC grade 2, 0.4 cm, IgG DCIS, lymph/vascular invasion identified, margins negative, ER positive, PR 40%, Ki-67 10%, HER-2 negative duration 1.25      10/01/2015 - 10/29/2015 Radiation Therapy    Adjuvant radiation therapy       CHIEF COMPLIANT: follow-up after radiation therapy  INTERVAL HISTORY: Kelsey Fuller is a 75 year old with above-mentioned history of right breast cancer treated with lumpectomy adjuvant radiation therapy. She is here today to discuss starting adjuvant antiestrogen therapy. She has done quite well from radiation standpoint. She denies any major problems with radiation dermatitis. Energy levels are back to her baseline.  REVIEW OF SYSTEMS:   Constitutional: Denies fevers, chills or abnormal weight loss Eyes: Denies blurriness of vision Ears, nose, mouth, throat, and face: Denies mucositis or sore throat Respiratory: Denies cough, dyspnea or wheezes Cardiovascular: Denies palpitation, chest discomfort Gastrointestinal:  Denies nausea, heartburn or change in bowel habits Skin: Denies abnormal skin rashes Lymphatics: Denies new lymphadenopathy or easy bruising Neurological:Denies numbness,  tingling or new weaknesses Behavioral/Psych: Mood is stable, no new changes  Extremities: No lower extremity edema Breast: recovered from radiation therapy All other systems were reviewed with the patient and are negative.  I have reviewed the past medical history, past surgical history, social history and family history with the patient and they are unchanged from previous note.  ALLERGIES:  is allergic to codeine; penicillins; sulfa antibiotics; and fish allergy.  MEDICATIONS:  Current Outpatient Prescriptions  Medication Sig Dispense Refill  . acetaminophen (TYLENOL) 325 MG tablet Take 650 mg by mouth every 6 (six) hours as needed. Reported on 08/20/2015    . aspirin 81 MG tablet Take 81 mg by mouth daily.    . Chlorphen-Phenyleph-ASA (ALKA-SELTZER PLUS COLD PO) Take 2 capsules by mouth every 4 (four) hours as needed.    . cholecalciferol (VITAMIN D) 1000 units tablet Take 1,000 Units by mouth daily.    . fluticasone (FLOVENT HFA) 110 MCG/ACT inhaler Inhale 1 puff into the lungs 2 (two) times daily. Reported on 10/02/2015    . ibuprofen (ADVIL,MOTRIN) 200 MG tablet Take 600 mg by mouth every 8 (eight) hours as needed for moderate pain.    Marland Kitchen latanoprost (XALATAN) 0.005 % ophthalmic solution Place 1 drop into both eyes at bedtime.   3  . Multiple Vitamins-Minerals (ALIVE WOMENS 50+ PO) Take 1 tablet by mouth daily.    Marland Kitchen SPIRIVA HANDIHALER 18 MCG inhalation capsule PLACE 1 CAPSULE INTO INHALER AND INHALE THE CONTENTS DAILY 90 capsule 0   No current facility-administered medications for this visit.     PHYSICAL EXAMINATION: ECOG PERFORMANCE STATUS: 1 - Symptomatic but completely  ambulatory  Vitals:   12/16/15 1106  BP: (!) 154/81  Pulse: 88  Resp: 18  Temp: 97.9 F (36.6 C)   Filed Weights   12/16/15 1106  Weight: 103 lb 12.8 oz (47.1 kg)    GENERAL:alert, no distress and comfortable SKIN: skin color, texture, turgor are normal, no rashes or significant lesions EYES: normal,  Conjunctiva are pink and non-injected, sclera clear OROPHARYNX:no exudate, no erythema and lips, buccal mucosa, and tongue normal  NECK: supple, thyroid normal size, non-tender, without nodularity LYMPH:  no palpable lymphadenopathy in the cervical, axillary or inguinal LUNGS: clear to auscultation and percussion with normal breathing effort HEART: regular rate & rhythm and no murmurs and no lower extremity edema ABDOMEN:abdomen soft, non-tender and normal bowel sounds MUSCULOSKELETAL:no cyanosis of digits and no clubbing  NEURO: alert & oriented x 3 with fluent speech, no focal motor/sensory deficits EXTREMITIES: No lower extremity edema  LABORATORY DATA:  I have reviewed the data as listed   Chemistry      Component Value Date/Time   NA 141 08/12/2015 1233   K 4.0 08/12/2015 1233   CL 104 10/29/2014 0001   CO2 26 08/12/2015 1233   BUN 16.1 08/12/2015 1233   CREATININE 0.8 08/12/2015 1233      Component Value Date/Time   CALCIUM 9.8 08/12/2015 1233   ALKPHOS 92 08/12/2015 1233   AST 18 08/12/2015 1233   ALT 16 08/12/2015 1233   BILITOT 0.90 08/12/2015 1233       Lab Results  Component Value Date   WBC 9.0 08/26/2015   HGB 13.9 08/26/2015   HCT 41.8 08/26/2015   MCV 87.6 08/26/2015   PLT 372 08/26/2015   NEUTROABS 4.9 08/12/2015     ASSESSMENT & PLAN:  Breast cancer of upper-inner quadrant of right female breast ([facility]) Right lumpectomy 09/01/2015: IDC grade 2, 0.4 cm, IgG DCIS, lymph/vascular invasion identified, margins negative, ER positive, PR 40%, Ki-67 10%, HER-2 negative duration 1.25  Treatment summary: 1. No indication for chemotherapy because of final tumor size was less than 0.5 cm 2. Adjuvant radiation therapy 10/01/2015 to 10/29/2015 3. Adjuvant antiestrogen therapy with anastrozole 1 mg daily 5 years started 12/16/2015  Anastrozole counseling:We discussed the risks and benefits of anti-estrogen therapy with aromatase inhibitors. These include but  not limited to insomnia, hot flashes, mood changes, vaginal dryness, bone density loss, and weight gain. We strongly believe that the benefits far outweigh the risks. Patient understands these risks and consented to starting treatment. Planned treatment duration is 5 years.  Osteoporosis: We discussed starting bisphosphonate therapy with Prolia. I will request an appointment for Prolia next week after insurance authorization. She will receive that every 6 months. She will also take calcium and vitamin D. We discussed issues related to teeth however patient does not have any natural teeth so that is no longer a concern.  Return to clinic in 6 months for toxicity check and follow-up along with Prolia injection..   No orders of the defined types were placed in this encounter.  The patient has a good understanding of the overall plan. she agrees with it. she will call with any problems that may develop before the next visit here.   Rulon Eisenmenger, MD 12/16/15

## 2015-12-16 NOTE — Assessment & Plan Note (Signed)
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 No indication for chemotherapy because of final tumor size was less than 0.5 cm 2. Adjuvant radiation therapy 10/01/2015 to 10/29/2015 3. Adjuvant antiestrogen therapy with anastrozole 1 mg daily 5 years started 12/16/2015  Anastrozole counseling:We discussed the risks and benefits of anti-estrogen therapy with aromatase inhibitors. These include but not limited to insomnia, hot flashes, mood changes, vaginal dryness, bone density loss, and weight gain. We strongly believe that the benefits far outweigh the risks. Patient understands these risks and consented to starting treatment. Planned treatment duration is 5 years.  Return to clinic in 3 months for toxicity check and follow-up.

## 2015-12-21 ENCOUNTER — Telehealth: Payer: Self-pay | Admitting: Hematology and Oncology

## 2015-12-21 ENCOUNTER — Other Ambulatory Visit: Payer: Self-pay | Admitting: *Deleted

## 2015-12-21 ENCOUNTER — Encounter: Payer: Self-pay | Admitting: *Deleted

## 2015-12-21 DIAGNOSIS — C50211 Malignant neoplasm of upper-inner quadrant of right female breast: Secondary | ICD-10-CM

## 2015-12-21 NOTE — Telephone Encounter (Signed)
12/22/2015 Appointment time changed per patient request. Patient has to take granddaughter to school.

## 2015-12-22 ENCOUNTER — Ambulatory Visit (HOSPITAL_BASED_OUTPATIENT_CLINIC_OR_DEPARTMENT_OTHER): Payer: Medicare Other

## 2015-12-22 ENCOUNTER — Ambulatory Visit: Admission: RE | Admit: 2015-12-22 | Payer: Medicare Other | Source: Ambulatory Visit | Admitting: Radiation Oncology

## 2015-12-22 ENCOUNTER — Other Ambulatory Visit (HOSPITAL_BASED_OUTPATIENT_CLINIC_OR_DEPARTMENT_OTHER): Payer: Medicare Other

## 2015-12-22 VITALS — BP 155/79 | HR 81 | Temp 98.4°F | Resp 20

## 2015-12-22 DIAGNOSIS — C50211 Malignant neoplasm of upper-inner quadrant of right female breast: Secondary | ICD-10-CM

## 2015-12-22 DIAGNOSIS — M8580 Other specified disorders of bone density and structure, unspecified site: Secondary | ICD-10-CM

## 2015-12-22 DIAGNOSIS — M810 Age-related osteoporosis without current pathological fracture: Secondary | ICD-10-CM

## 2015-12-22 LAB — COMPREHENSIVE METABOLIC PANEL
ALBUMIN: 3.7 g/dL (ref 3.5–5.0)
ALK PHOS: 88 U/L (ref 40–150)
ALT: 17 U/L (ref 0–55)
AST: 19 U/L (ref 5–34)
Anion Gap: 8 mEq/L (ref 3–11)
BILIRUBIN TOTAL: 0.43 mg/dL (ref 0.20–1.20)
BUN: 21.1 mg/dL (ref 7.0–26.0)
CO2: 27 mEq/L (ref 22–29)
CREATININE: 0.8 mg/dL (ref 0.6–1.1)
Calcium: 10.6 mg/dL — ABNORMAL HIGH (ref 8.4–10.4)
Chloride: 107 mEq/L (ref 98–109)
EGFR: 69 mL/min/{1.73_m2} — AB (ref >90)
GLUCOSE: 84 mg/dL (ref 70–140)
Potassium: 4.2 mEq/L (ref 3.5–5.1)
SODIUM: 143 meq/L (ref 136–145)
TOTAL PROTEIN: 7.3 g/dL (ref 6.4–8.3)

## 2015-12-22 MED ORDER — DENOSUMAB 60 MG/ML ~~LOC~~ SOLN
60.0000 mg | SUBCUTANEOUS | Status: DC
Start: 2015-12-22 — End: 2015-12-22
  Administered 2015-12-22: 60 mg via SUBCUTANEOUS
  Filled 2015-12-22: qty 1

## 2015-12-22 NOTE — Patient Instructions (Signed)
Denosumab injection  What is this medicine?  DENOSUMAB (den oh sue mab) slows bone breakdown. Prolia is used to treat osteoporosis in women after menopause and in men. Xgeva is used to prevent bone fractures and other bone problems caused by cancer bone metastases. Xgeva is also used to treat giant cell tumor of the bone.  This medicine may be used for other purposes; ask your health care provider or pharmacist if you have questions.  What should I tell my health care provider before I take this medicine?  They need to know if you have any of these conditions:  -dental disease  -eczema  -infection or history of infections  -kidney disease or on dialysis  -low blood calcium or vitamin D  -malabsorption syndrome  -scheduled to have surgery or tooth extraction  -taking medicine that contains denosumab  -thyroid or parathyroid disease  -an unusual reaction to denosumab, other medicines, foods, dyes, or preservatives  -pregnant or trying to get pregnant  -breast-feeding  How should I use this medicine?  This medicine is for injection under the skin. It is given by a health care professional in a hospital or clinic setting.  If you are getting Prolia, a special MedGuide will be given to you by the pharmacist with each prescription and refill. Be sure to read this information carefully each time.  For Prolia, talk to your pediatrician regarding the use of this medicine in children. Special care may be needed. For Xgeva, talk to your pediatrician regarding the use of this medicine in children. While this drug may be prescribed for children as young as 13 years for selected conditions, precautions do apply.  Overdosage: If you think you have taken too much of this medicine contact a poison control center or emergency room at once.  NOTE: This medicine is only for you. Do not share this medicine with others.  What if I miss a dose?  It is important not to miss your dose. Call your doctor or health care professional if you are  unable to keep an appointment.  What may interact with this medicine?  Do not take this medicine with any of the following medications:  -other medicines containing denosumab  This medicine may also interact with the following medications:  -medicines that suppress the immune system  -medicines that treat cancer  -steroid medicines like prednisone or cortisone  This list may not describe all possible interactions. Give your health care provider a list of all the medicines, herbs, non-prescription drugs, or dietary supplements you use. Also tell them if you smoke, drink alcohol, or use illegal drugs. Some items may interact with your medicine.  What should I watch for while using this medicine?  Visit your doctor or health care professional for regular checks on your progress. Your doctor or health care professional may order blood tests and other tests to see how you are doing.  Call your doctor or health care professional if you get a cold or other infection while receiving this medicine. Do not treat yourself. This medicine may decrease your body's ability to fight infection.  You should make sure you get enough calcium and vitamin D while you are taking this medicine, unless your doctor tells you not to. Discuss the foods you eat and the vitamins you take with your health care professional.  See your dentist regularly. Brush and floss your teeth as directed. Before you have any dental work done, tell your dentist you are receiving this medicine.  Do   not become pregnant while taking this medicine or for 5 months after stopping it. Women should inform their doctor if they wish to become pregnant or think they might be pregnant. There is a potential for serious side effects to an unborn child. Talk to your health care professional or pharmacist for more information.  What side effects may I notice from receiving this medicine?  Side effects that you should report to your doctor or health care professional as soon as  possible:  -allergic reactions like skin rash, itching or hives, swelling of the face, lips, or tongue  -breathing problems  -chest pain  -fast, irregular heartbeat  -feeling faint or lightheaded, falls  -fever, chills, or any other sign of infection  -muscle spasms, tightening, or twitches  -numbness or tingling  -skin blisters or bumps, or is dry, peels, or red  -slow healing or unexplained pain in the mouth or jaw  -unusual bleeding or bruising  Side effects that usually do not require medical attention (Report these to your doctor or health care professional if they continue or are bothersome.):  -muscle pain  -stomach upset, gas  This list may not describe all possible side effects. Call your doctor for medical advice about side effects. You may report side effects to FDA at 1-800-FDA-1088.  Where should I keep my medicine?  This medicine is only given in a clinic, doctor's office, or other health care setting and will not be stored at home.  NOTE: This sheet is a summary. It may not cover all possible information. If you have questions about this medicine, talk to your doctor, pharmacist, or health care provider.      2016, Elsevier/Gold Standard. (2011-09-26 12:37:47)

## 2015-12-23 ENCOUNTER — Ambulatory Visit
Admission: RE | Admit: 2015-12-23 | Discharge: 2015-12-23 | Disposition: A | Discharge status: Home / Self Care | Payer: Medicare Other | Source: Ambulatory Visit | Attending: Family Medicine | Admitting: Family Medicine

## 2015-12-23 ENCOUNTER — Encounter: Payer: Self-pay | Admitting: Family Medicine

## 2015-12-23 DIAGNOSIS — M5441 Lumbago with sciatica, right side: Secondary | ICD-10-CM

## 2015-12-23 DIAGNOSIS — M5126 Other intervertebral disc displacement, lumbar region: Secondary | ICD-10-CM | POA: Diagnosis not present

## 2015-12-24 ENCOUNTER — Telehealth: Payer: Self-pay

## 2015-12-24 NOTE — Telephone Encounter (Signed)
Pt called stating she was returning your call

## 2015-12-29 NOTE — Telephone Encounter (Signed)
Kelsey Fuller has handled this

## 2015-12-31 DIAGNOSIS — M5126 Other intervertebral disc displacement, lumbar region: Secondary | ICD-10-CM | POA: Diagnosis not present

## 2015-12-31 DIAGNOSIS — R030 Elevated blood-pressure reading, without diagnosis of hypertension: Secondary | ICD-10-CM | POA: Diagnosis not present

## 2016-01-01 ENCOUNTER — Encounter: Payer: Self-pay | Admitting: Radiation Oncology

## 2016-01-01 ENCOUNTER — Ambulatory Visit
Admission: RE | Admit: 2016-01-01 | Discharge: 2016-01-01 | Disposition: A | Discharge status: Home / Self Care | Payer: Medicare Other | Source: Ambulatory Visit | Attending: Radiation Oncology | Admitting: Radiation Oncology

## 2016-01-01 VITALS — BP 167/73 | HR 74 | Temp 98.0°F | Ht 63.0 in | Wt 104.8 lb

## 2016-01-01 DIAGNOSIS — Z79899 Other long term (current) drug therapy: Secondary | ICD-10-CM | POA: Diagnosis not present

## 2016-01-01 DIAGNOSIS — Z7982 Long term (current) use of aspirin: Secondary | ICD-10-CM | POA: Insufficient documentation

## 2016-01-01 DIAGNOSIS — M5137 Other intervertebral disc degeneration, lumbosacral region: Secondary | ICD-10-CM | POA: Diagnosis not present

## 2016-01-01 DIAGNOSIS — Z170 Estrogen receptor positive status [ER+]: Secondary | ICD-10-CM | POA: Diagnosis not present

## 2016-01-01 DIAGNOSIS — I10 Essential (primary) hypertension: Secondary | ICD-10-CM | POA: Insufficient documentation

## 2016-01-01 DIAGNOSIS — Z880 Allergy status to penicillin: Secondary | ICD-10-CM | POA: Diagnosis not present

## 2016-01-01 DIAGNOSIS — D0511 Intraductal carcinoma in situ of right breast: Secondary | ICD-10-CM | POA: Insufficient documentation

## 2016-01-01 DIAGNOSIS — C50211 Malignant neoplasm of upper-inner quadrant of right female breast: Secondary | ICD-10-CM

## 2016-01-01 DIAGNOSIS — Z888 Allergy status to other drugs, medicaments and biological substances status: Secondary | ICD-10-CM | POA: Diagnosis not present

## 2016-01-01 NOTE — Progress Notes (Signed)
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  Interval Since Last Radiation:  8 weeks   10/01/15-10/30/15: Total of 50 Gy: 42.5 Gy to the right breast with a 7.5 Gy boost to the lumpectomy cavity  Narrative:  The patient returns today for routine follow-up. She is doing very well since completing radiotherapy and has begun estrogen blockade with Dr. Lindi Adie.                              On review of systems, the patient states she is doing great. She has some fatigue but denies any concerns with her skin. She is not having chest pain, shortness of breath, fevers, or chills. No other complaints are noted.  ALLERGIES:  is allergic to codeine; penicillins; sulfa antibiotics; and fish allergy.  Meds: Current Outpatient Prescriptions  Medication Sig Dispense Refill  . acetaminophen (TYLENOL) 325 MG tablet Take 650 mg by mouth every 6 (six) hours as needed. Reported on 08/20/2015    . anastrozole (ARIMIDEX) 1 MG tablet Take 1 tablet (1 mg total) by mouth daily. 90 tablet 3  . aspirin 81 MG tablet Take 81 mg by mouth daily.    . Chlorphen-Phenyleph-ASA (ALKA-SELTZER PLUS COLD PO) Take 2 capsules by mouth every 4 (four) hours as needed.    . cholecalciferol (VITAMIN D) 1000 units tablet Take 1,000 Units by mouth daily.    Marland Kitchen denosumab (PROLIA) 60 MG/ML SOLN injection Inject 60 mg into the skin every 6 (six) months. Administer in upper arm, thigh, or abdomen    . fluticasone (FLOVENT HFA) 110 MCG/ACT inhaler Inhale 1 puff into the lungs 2 (two) times daily. Reported on 10/02/2015    . ibuprofen (ADVIL,MOTRIN) 200 MG tablet Take 600 mg by mouth every 8 (eight) hours as needed for moderate pain.    Marland Kitchen latanoprost (XALATAN) 0.005 % ophthalmic solution  Place 1 drop into both eyes at bedtime.   3  . Multiple Vitamins-Minerals (ALIVE WOMENS 50+ PO) Take 1 tablet by mouth daily.    Marland Kitchen SPIRIVA HANDIHALER 18 MCG inhalation capsule PLACE 1 CAPSULE INTO INHALER AND INHALE THE CONTENTS DAILY 90 capsule 0   No current facility-administered medications for this encounter.     Physical Findings:  height is 5\' 3"  (1.6 m) and weight is 104 lb 12.8 oz (47.5 kg). Her oral temperature is 98 F (36.7 C). Her blood pressure is 167/73 (abnormal) and her pulse is 74.  In general this is a well appearing caucasian female in no acute distress. She's alert and oriented x4 and appropriate throughout the examination. Cardiopulmonary assessment is negative for acute distress and she exhibits normal effort. The right breast is intact without evidence of desquamation or breakdown. No hyperpigmentation is noted.   Lab Findings: Lab Results  Component Value Date   WBC 9.0 08/26/2015   HGB 13.9 08/26/2015   HCT 41.8 08/26/2015   MCV 87.6 08/26/2015   PLT 372 08/26/2015     Radiographic Findings: Mr Lumbar Spine Wo Contrast  Result Date: 12/23/2015 CLINICAL DATA:  Right low back pain radiating down the right leg to the ankle. History of breast cancer. EXAM: MRI LUMBAR SPINE WITHOUT CONTRAST  TECHNIQUE: Multiplanar, multisequence MR imaging of the lumbar spine was performed. No intravenous contrast was administered. COMPARISON:  Radiographs from 08/05/2015 FINDINGS: Segmentation: 12 pairs of ribs and 6 lumbar type non-rib-bearing vertebra, the lowest of which is transitional with a large right transverse process which pseudo articulates with the sacrum. Given this situation, the transitional lumbosacral vertebra is labeled S1. This is a revision of the numbering on the lumbar spine radiographs from 08/05/2015, and very careful attention to the new numbering strategy is recommended. Alignment: 3 mm degenerative anterolisthesis at L4-5 and L5-S 1. 3 mm degenerative  retrolisthesis at L2- 3. Vertebrae: Mild type 1 degenerative endplate findings at [id number]-GRM with loss of disc height. Disc desiccation throughout the lumbar spine. The intervertebral disc at the S1-2 level is relatively full sized. 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 Conus medullaris: Extends to the L1 level and appears normal. Paraspinal and other soft tissues: Unremarkable Disc levels: L2-3: No impingement. Disc bulge and small central disc protrusion. L3-4: Mild displacement of the right L3 nerve in the lateral extraforaminal space, image 10/7, and borderline bilateral subarticular lateral recess stenosis due to disc bulge, facet arthropathy, and small right lateral extraforaminal disc protrusion. L4-5: Mild bilateral subarticular lateral recess stenosis, mild displacement of the right L4 nerve in the lateral extraforaminal space, and borderline central narrowing of the thecal sac due to facet arthropathy, ligamentum flavum redundancy, disc uncovering, and disc bulge. L5-S1: Prominent central narrowing of the thecal sac with mild to moderate left and mild right foraminal stenosis and prominent left and moderate right subarticular lateral recess stenosis due to facet arthropathy, ligament flavum redundancy, disc uncovering, central disc protrusion, and intervertebral spurring. Conjoined left L5 and S1 nerve roots. S1-2: Mild left and borderline right subarticular lateral recess stenosis due to left paracentral disc protrusion, disc bulge, and facet arthropathy. IMPRESSION: 1. First, please note that because of the presence of 12 pairs of ribs in the chest and 6 lumbar type non-rib-bearing vertebra, the lowest of which is somewhat transitional, the lowest lumbar type non-rib-bearing vertebra is labeled S1. THIS REPRESENTS A REVISION TO THE NUMBERING ON THE PRIOR LUMBAR RADIOGRAPHS FROM 08/05/2015. Careful correlation with this numbering strategy is recommended if procedural intervention is  to be employed. 2. 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 Electronically Signed   By: Van Clines M.D.   On: 12/23/2015 13:27    Impression/Plan: 1. 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 The patient has done will since completion of radiotherapy. She will continue on her aromatase inhibitor with Dr. Lindi Adie. We will plan to see her back on an as needed schedule. She states agreement and understanding. 2. Survivorship. I have encouraged her to keep her appointment in October with Mike Craze, NP in survivorship clinic.  3. Hypertension. The patient is asymptomatic. She will follow up with her PCP for management of this.    Carola Rhine, PAC

## 2016-01-01 NOTE — Progress Notes (Signed)
Kelsey Fuller here S/P XRT to her right breast.  She denies any pain in this area.  Skin remains intact without any evidence of hyperpigmentation nor erythema.  Also reports fatigue.  BP elevated which she states is the norm and is not taking any blood pressure medications.  BP (!) 167/73 (BP Location: Left Arm, Patient Position: Sitting, Cuff Size: Normal)   Pulse 74   Temp 98 F (36.7 C) (Oral)   Ht 5\' 3"  (1.6 m)   Wt 104 lb 12.8 oz (47.5 kg)   BMI 18.56 kg/m     Wt Readings from Last 3 Encounters:  01/01/16 104 lb 12.8 oz (47.5 kg)  12/16/15 103 lb 12.8 oz (47.1 kg)  12/09/15 103 lb (46.7 kg)

## 2016-01-04 NOTE — Addendum Note (Signed)
Encounter addended by: Benn Moulder, RN on: 01/04/2016  8:26 AM<BR>    Actions taken: Chief Complaint modified

## 2016-01-14 ENCOUNTER — Encounter: Payer: Self-pay | Admitting: Adult Health

## 2016-01-14 ENCOUNTER — Ambulatory Visit (HOSPITAL_BASED_OUTPATIENT_CLINIC_OR_DEPARTMENT_OTHER): Payer: Medicare Other | Admitting: Adult Health

## 2016-01-14 VITALS — BP 169/76 | HR 75 | Temp 97.6°F | Resp 18 | Ht 63.0 in | Wt 105.3 lb

## 2016-01-14 DIAGNOSIS — Z170 Estrogen receptor positive status [ER+]: Secondary | ICD-10-CM | POA: Diagnosis not present

## 2016-01-14 DIAGNOSIS — C50211 Malignant neoplasm of upper-inner quadrant of right female breast: Secondary | ICD-10-CM

## 2016-01-14 DIAGNOSIS — M810 Age-related osteoporosis without current pathological fracture: Secondary | ICD-10-CM | POA: Diagnosis not present

## 2016-01-14 NOTE — Patient Instructions (Signed)
Thank you so much for coming in to see me today. I really enjoyed meeting you!   Please feel free to call me with any questions or concerns!  Mike Craze, NP [place] [phone]

## 2016-01-14 NOTE — Progress Notes (Signed)
CLINIC:  Survivorship   REASON FOR VISIT:  Routine follow-up post-treatment for a recent history of breast cancer.  BRIEF ONCOLOGIC HISTORY:    Breast cancer of upper-inner quadrant of right female breast ([place])   06/30/2015 Imaging    DEXA scan: Osteoporosis (T-score -3.1)      08/06/2015 Mammogram    Right breast mammogram and ultrasound revealed asymmetry posteriorly 4 x 4 x 3 mm, axilla negative,Tis N0 stage 0      08/06/2015 Initial Diagnosis    Right breast biopsy 2:00 position goal DCIS with necrosis intermediate grade,ER 100%, PR 85%,?foci of early stromal invasion      09/01/2015 Surgery    Right lumpectomy [facility]): IDC grade 2, 0.4 cm, intermediate grade DCIS, lymph/vascular invasion identified, margins negative, ER 100%, PR 40%, Ki-67 10%, HER-2 negative (ratio 1.25).  pT1a,pNx      10/01/2015 - 10/30/2015 Radiation Therapy    Adjuvant radiation therapy Lisbeth Renshaw). The Right breast was treated to 42.5 Gy in 17 fractions at 2.5 Gy per fraction. Right breast was boosted to 7.5 Gy in 3 fractions at 2.5 Gy per fraction.       12/16/2015 -  Anti-estrogen oral therapy    Anastrozole 1 mg daily. Planned duration of therapy: 5 years.        INTERVAL HISTORY:  Ms. Crossman presents to the Survivorship Clinic today for our initial meeting to review her survivorship care plan detailing her treatment course for breast cancer, as well as monitoring long-term side effects of that treatment, education regarding health maintenance, screening, and overall wellness and health promotion.     Overall, Ms. Culverhouse reports feeling quite well since completing her radiation therapy approximately 2.5 months ago.  She still feels tired at times, but this is slowly getting better. She continues to have some right breast pain, that is tender to touch. She wants to know if this is normal. She reports feeling more sad/tearful lately; she shared with me that her husband of 50+ years passed away about 1.5 years  ago. She tells me that when she came into the cancer center today she saw the [place] sign & this triggered a memory of her husband for her because he used to work for [employer]. She tells me that she is not overcome with sadness every day, and is continuing to grieve with the support of her family and friends. She is able to still enjoy the things she used to enjoy, and does not feel like most of her days are spent being sad.   She started the anastrozole about one month ago. Thus far she has had largely no side effects from the medication. Denies hot flashes, arthralgias, or vaginal dryness.   REVIEW OF SYSTEMS:  Review of Systems  Constitutional: Positive for malaise/fatigue.  HENT:       She notes some taste changes (chronic)  Eyes: Negative.   Respiratory: Negative.   Cardiovascular: Negative.   Gastrointestinal: Negative.   Genitourinary: Negative.   Musculoskeletal: Negative.   Skin:       Right breast tenderness; skin well-healed since finishing radiation.  Neurological: Negative.   Endo/Heme/Allergies: Negative.   Psychiatric/Behavioral:       Sad/tearful at times  GU: Denies vaginal bleeding, discharge, or dryness.  Breast: Denies any new nodularity, masses, tenderness, nipple changes, or nipple discharge.    A 14-point review of systems was completed and was negative, except as noted above.   ONCOLOGY TREATMENT TEAM:  1. Surgeon:  Dr. Barry Dienes at [facility] 2. Medical Oncologist: Dr. Lindi Adie  3. Radiation Oncologist: Dr. Lisbeth Renshaw    PAST MEDICAL/SURGICAL HISTORY:  Past Medical History:  Diagnosis Date  . Allergy   . Arthritis   . Asthma    (pt denies)  . Breast cancer ([place])    ductal CA in situ with necrosis on bx 07/2015 (R breast)  . Breast cancer of upper-inner quadrant of right female breast ([place]) 08/07/2015  . Chronic headaches    (resolved)  . Complication of anesthesia   . COPD (chronic obstructive pulmonary disease) ([place])   . Glaucoma     . Leg pain   . Osteoporosis   . PONV (postoperative nausea and vomiting)   . Shortness of breath on exertion   . Thrombophlebitis    Past Surgical History:  Procedure Laterality Date  . APPENDECTOMY    . BREAST LUMPECTOMY WITH RADIOACTIVE SEED LOCALIZATION Right 09/01/2015   Procedure: RADIOACTIVE SEED GUIDED RIGHT BREAST LUMPECTOMY;  Surgeon: Stark Klein, MD;  Location: [facility];  Service: General;  Laterality: Right;  . BREAST SURGERY    . TONSILLECTOMY    . VARICOSE VEIN SURGERY       ALLERGIES:  Allergies  Allergen Reactions  . Codeine Other (See Comments)  . Penicillins Other (See Comments)  . Sulfa Antibiotics Other (See Comments)    Told as a child by her mother never to take  . Fish Allergy Diarrhea    Scaled fish- when pregnant ate some recently no problem     CURRENT MEDICATIONS:  Outpatient Encounter Prescriptions as of 01/14/2016  Medication Sig Note  . acetaminophen (TYLENOL) 325 MG tablet Take 650 mg by mouth every 6 (six) hours as needed. Reported on 08/20/2015   . anastrozole (ARIMIDEX) 1 MG tablet Take 1 tablet (1 mg total) by mouth daily.   Marland Kitchen aspirin 81 MG tablet Take 81 mg by mouth daily.   . cholecalciferol (VITAMIN D) 1000 units tablet Take 1,000 Units by mouth daily.   Marland Kitchen denosumab (PROLIA) 60 MG/ML SOLN injection Inject 60 mg into the skin every 6 (six) months. Administer in upper arm, thigh, or abdomen   . fluticasone (FLOVENT HFA) 110 MCG/ACT inhaler Inhale 1 puff into the lungs 2 (two) times daily. Reported on 10/02/2015 12/09/2015: Taking 2 puffs once daily  . ibuprofen (ADVIL,MOTRIN) 200 MG tablet Take 600 mg by mouth every 8 (eight) hours as needed for moderate pain.   Marland Kitchen latanoprost (XALATAN) 0.005 % ophthalmic solution Place 1 drop into both eyes at bedtime.    . Multiple Vitamins-Minerals (ALIVE WOMENS 50+ PO) Take 1 tablet by mouth daily.   Marland Kitchen SPIRIVA HANDIHALER 18 MCG inhalation capsule PLACE 1 CAPSULE INTO INHALER AND INHALE THE CONTENTS DAILY   .  [DISCONTINUED] Chlorphen-Phenyleph-ASA (ALKA-SELTZER PLUS COLD PO) Take 2 capsules by mouth every 4 (four) hours as needed.    No facility-administered encounter medications on file as of 01/14/2016.      ONCOLOGIC FAMILY HISTORY:  Family History  Problem Relation Age of Onset  . Diabetes Mother   . Heart disease Mother     congestive heart failure  . Hypertension Mother   . Dementia Mother   . Heart disease Father     rheumatic heart disease  . Deep vein thrombosis Son   . Deep vein thrombosis Maternal Grandfather   . Pulmonary embolism Son     after a boating accident, long drive; had DVT/PE  . Deep vein thrombosis Cousin   .  Cancer Neg Hx      GENETIC COUNSELING/TESTING: None.  SOCIAL HISTORY:  OTILLIA CORDONE is widowed and lives in [place], [place]. She has 4 sons, who all live in this area. She has 3 grandchildren & and one great grandchild. 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 She enjoys gardening in her spare time. She denies any current tobacco, alcohol, or illicit drug use.     PHYSICAL EXAMINATION:  Vital Signs:   Vitals:   01/14/16 0947  BP: (!) 169/76  Pulse: 75  Resp: 18  Temp: 97.6 F (36.4 C)   Filed Weights   01/14/16 0947  Weight: 105 lb 4.8 oz (47.8 kg)   General: Well-nourished, well-appearing female in no acute distress.  She is unaccompanied today.   HEENT: Head is normocephalic.  Pupils equal and reactive to light. Conjunctivae clear without exudate.  Sclerae anicteric. Oral mucosa is pink, moist.  Oropharynx is pink without lesions or erythema.  Lymph: No cervical, supraclavicular, or infraclavicular lymphadenopathy noted on palpation.  Cardiovascular: Regular rate and rhythm.Marland Kitchen Respiratory: Clear to auscultation bilaterally. Chest expansion symmetric; breathing non-labored.  GI: Abdomen soft and round; non-tender, non-distended. Bowel sounds normoactive.  GU: Deferred.  Neuro: No focal  deficits. Steady gait.  Psych: Mood and affect normal and appropriate for situation.  Extremities: No edema. Skin: Warm and dry.  LABORATORY DATA:  None for this visit.  DIAGNOSTIC IMAGING:  None for this visit.      ASSESSMENT AND PLAN:  Ms.. Hinchcliff is a pleasant 75 y.o. female with Stage IA right breast invasive ductal carcinoma, ER+/PR+/HER2-, diagnosed in 07/2015; treated with lumpectomy, adjuvant radiation therapy, and anti-estrogen therapy with anastrozole beginning in 12/2015.  She presents to the Survivorship Clinic for our initial meeting and routine follow-up post-completion of treatment for breast cancer.    1. Stage IA right breast cancer:  Ms. Stambaugh is continuing to recover from definitive treatment for breast cancer. She will follow-up with her medical oncologist, Dr. Lindi Adie in 06/2016 with history and physical exam per surveillance protocol.  She will continue her anti-estrogen therapy with anastrozole. Thus far, she is tolerating the medication well, with minimal side effects. She understands that it can take up to 3 months for [place] to know how well she will tolerate the anastrozole. She was instructed to make Dr. Lindi Adie or myself aware if she begins to experience any worsening side effects of the medication and I could see her back in clinic to help manage those side effects, as needed. Common side effects of anastrozole were again reviewed with her as well. Today, a comprehensive survivorship care plan and treatment summary was reviewed with the patient today detailing her breast cancer diagnosis, treatment course, potential late/long-term effects of treatment, appropriate follow-up care with recommendations for the future, and patient education resources.  A copy of this summary, along with a letter will be sent to the patient's primary care provider via mail/fax/In Basket message after today's visit.    2. Grief response: Ms. Murtagh is continuing to appropriately grieve the tremendous  loss of her husband of 50+ years. She appears to be coping well, and reportedly has a great support system around her in her family and friends. She tells me that she did receive formal grief counseling shortly after her husband passed away 1.5 years ago, which was very helpful for her. We discussed the nature of grief, and how unpredictable it can be at times.  I validated her concerns today, and recognized that she experienced a "trigger" by coming to a [place] facility today, given that her husband worked for [employer] for so many years. I offered her additional support with our chaplain, Lorrin Jackson, [facility] as she is specialized in helping with grief. Ms. Ventura feels like she is coping well at this time, but expresses that she will reach out to [place] for additional support if needed. She was given a Spiritual Care department for sure with instructions on how to contact Lattie Haw if she needs her.  3. Right breast pain: She was reassured that the right breast pain is normal and does not generally indicate a sign of recurrence.We discussed that mastalgia is very common in patients treated with lumpectomy and radiation for breast cancer. She understands that the mastalgia will likely continue to improve with time, as she continues to heal. If she is one of the small percentage of women who experienced chronic mastalgia pain, and then treatment can be initiated in the future. However I do not think this will be the case, as she is likely experiencing acute effects of recent completion of treatment for breast cancer.   4. Bone health:  Given Ms. 9 age, history of breast cancer, and her current treatment regimen including anti-estrogen therapy with anastrozole, she is at risk for bone demineralization.  Her last DEXA scan was 06/30/15 and showed osteoporosis. She is taking vitamin D supplementation as well as Prolia injections; her next Prolia injection will be due in 06/2016 when she sees Dr. Lindi Adie. In the  meantime, she was encouraged to increase her consumption of foods rich in calcium, as well as increase her weight-bearing activities.  She was given education on specific activities to promote bone health.  5. Cancer screening:  Due to Ms. Cragun's history and her age, she should receive screening for skin cancers, colon cancer, and gynecologic cancers.  The information and recommendations are listed on the patient's comprehensive care plan/treatment summary and were reviewed in detail with the patient.    6. Health maintenance and wellness promotion: Ms. Diss was encouraged to consume 5-7 servings of fruits and vegetables per day. We reviewed the "Nutrition Rainbow" handout. She was also encouraged to engage in moderate to vigorous exercise for 30 minutes per day most days of the week. We discussed the [employer] fitness program, which is designed for cancer survivors to help them become more physically fit after cancer treatments.  She was instructed to limit her alcohol consumption and continue to abstain from tobacco use.   7. Support services/counseling: It is not uncommon for this period of the patient's cancer care trajectory to be one of many emotions and stressors.  We discussed an opportunity for her to participate in the next session of [facility] ("Finding Your New Normal") support group series designed for patients after they have completed treatment.   Ms. Spengler was encouraged to take advantage of our many other support services programs, support groups, and/or counseling in coping with her new life as a cancer survivor after completing anti-cancer treatment.  She was offered support today through active listening and expressive supportive counseling.  She was given information regarding our available services and encouraged to contact me with any questions or for help enrolling in any of our support group/programs.    Dispo:   -Return to cancer center to see Dr. Lindi Adie in 06/2016. -She is  welcome to return back to the Survivorship Clinic at any time; no  additional follow-up needed at this time.  -Consider referral back to survivorship as a long-term survivor for continued surveillance  A total of 30 minutes of face-to-face time was spent with this patient with greater than 50% of that time in counseling and care-coordination.   Mike Craze, NP Survivorship Program [place] [phone]   Note: PRIMARY CARE PROVIDER [name], [place] [phone]

## 2016-01-18 ENCOUNTER — Ambulatory Visit (INDEPENDENT_AMBULATORY_CARE_PROVIDER_SITE_OTHER): Payer: Medicare Other | Admitting: Family Medicine

## 2016-01-18 VITALS — BP 130/70 | HR 84 | Temp 98.3°F | Ht 63.0 in | Wt 103.2 lb

## 2016-01-18 DIAGNOSIS — M5126 Other intervertebral disc displacement, lumbar region: Secondary | ICD-10-CM | POA: Diagnosis not present

## 2016-01-18 DIAGNOSIS — R350 Frequency of micturition: Secondary | ICD-10-CM

## 2016-01-18 DIAGNOSIS — M5416 Radiculopathy, lumbar region: Secondary | ICD-10-CM | POA: Diagnosis not present

## 2016-01-18 DIAGNOSIS — R300 Dysuria: Secondary | ICD-10-CM

## 2016-01-18 DIAGNOSIS — N3000 Acute cystitis without hematuria: Secondary | ICD-10-CM

## 2016-01-18 LAB — POCT URINALYSIS DIPSTICK
BILIRUBIN UA: NEGATIVE
Glucose, UA: NEGATIVE
KETONES UA: NEGATIVE
NITRITE UA: NEGATIVE
PH UA: 6
Spec Grav, UA: 1.025
Urobilinogen, UA: NEGATIVE

## 2016-01-18 MED ORDER — NITROFURANTOIN MONOHYD MACRO 100 MG PO CAPS: 100.0000 mg | ORAL_CAPSULE | Freq: Two times a day (BID) | ORAL | 0 refills | Status: DC

## 2016-01-18 NOTE — Patient Instructions (Addendum)
Take the antibiotic twice daily. Drink plenty of fluids. Always wipe front to back. Try and not hold your urine for extended periods of time. Contact us if your symptoms are significantly improved within 24-48 hours. Contact us if you develop fever, vomiting, or if you are having trouble tolerating the antibiotic.  Let Dr. Brien Few be aware of the infection--he may want to delay any injection until it is cleared.   Urinary Tract Infection Urinary tract infections (UTIs) can develop anywhere along your urinary tract. Your urinary tract is your body's drainage system for removing wastes and extra water. Your urinary tract includes two kidneys, two ureters, a bladder, and a urethra. Your kidneys are a pair of bean-shaped organs. Each kidney is about the size of your fist. They are located below your ribs, one on each side of your spine. CAUSES Infections are caused by microbes, which are microscopic organisms, including fungi, viruses, and bacteria. These organisms are so small that they can only be seen through a microscope. Bacteria are the microbes that most commonly cause UTIs. SYMPTOMS  Symptoms of UTIs may vary by age and gender of the patient and by the location of the infection. Symptoms in young women typically include a frequent and intense urge to urinate and a painful, burning feeling in the bladder or urethra during urination. Older women and men are more likely to be tired, shaky, and weak and have muscle aches and abdominal pain. A fever may mean the infection is in your kidneys. Other symptoms of a kidney infection include pain in your back or sides below the ribs, nausea, and vomiting. DIAGNOSIS To diagnose a UTI, your caregiver will ask you about your symptoms. Your caregiver will also ask you to provide a urine sample. The urine sample will be tested for bacteria and white blood cells. White blood cells are made by your body to help fight infection. TREATMENT  Typically, UTIs can be  treated with medication. Because most UTIs are caused by a bacterial infection, they usually can be treated with the use of antibiotics. The choice of antibiotic and length of treatment depend on your symptoms and the type of bacteria causing your infection. HOME CARE INSTRUCTIONS  If you were prescribed antibiotics, take them exactly as your caregiver instructs you. Finish the medication even if you feel better after you have only taken some of the medication.  Drink enough water and fluids to keep your urine clear or pale yellow.  Avoid caffeine, tea, and carbonated beverages. They tend to irritate your bladder.  Empty your bladder often. Avoid holding urine for long periods of time.  Empty your bladder before and after sexual intercourse.  After a bowel movement, women should cleanse from front to back. Use each tissue only once. SEEK MEDICAL CARE IF:   You have back pain.  You develop a fever.  Your symptoms do not begin to resolve within 3 days. SEEK IMMEDIATE MEDICAL CARE IF:   You have severe back pain or lower abdominal pain.  You develop chills.  You have nausea or vomiting.  You have continued burning or discomfort with urination. MAKE SURE YOU:   Understand these instructions.  Will watch your condition.  Will get help right away if you are not doing well or get worse.   This information is not intended to replace advice given to you by your health care provider. Make sure you discuss any questions you have with your health care provider.   Document Released: 01/05/2005 Document  Revised: 12/17/2014 Document Reviewed: 05/06/2011 Elsevier Interactive Patient Education [employer].

## 2016-01-18 NOTE — Progress Notes (Signed)
Chief Complaint  Patient presents with  . Urinary Frequency    and burning as well as some abdominal pain x several days.    A few days ago she started with urinary frequency, dysuria. There is a slight odor to the urine, but no blood seen.  Denies incontinence, denies vaginal discharge. She is having some mild lower abdominal pain. Denies flank pain, fever, chills, nausea or vomiting.  Last UTI was 1 year ago, showing E.coli, sensitive to all antibiotics. She was treated with Cipro.  She admits to holding her urine for long times, occasional loose bowels.  Not sexually active.  Pain varies from 1 up to 10 at the right hip/buttock and down the right leg. She is seeing Dr. Brien Few for a consultation today (possible injection on Wednesday)  PMH, [place], [place] reviewed.  Outpatient Encounter Prescriptions as of 01/18/2016  Medication Sig Note  . anastrozole (ARIMIDEX) 1 MG tablet Take 1 tablet (1 mg total) by mouth daily.   Marland Kitchen aspirin 81 MG tablet Take 81 mg by mouth daily.   . cholecalciferol (VITAMIN D) 1000 units tablet Take 1,000 Units by mouth daily.   Marland Kitchen denosumab (PROLIA) 60 MG/ML SOLN injection Inject 60 mg into the skin every 6 (six) months. Administer in upper arm, thigh, or abdomen   . fluticasone (FLOVENT HFA) 110 MCG/ACT inhaler Inhale 1 puff into the lungs 2 (two) times daily. Reported on 10/02/2015 12/09/2015: Taking 2 puffs once daily  . latanoprost (XALATAN) 0.005 % ophthalmic solution Place 1 drop into both eyes at bedtime.    . Multiple Vitamins-Minerals (ALIVE WOMENS 50+ PO) Take 1 tablet by mouth daily.   Marland Kitchen SPIRIVA HANDIHALER 18 MCG inhalation capsule PLACE 1 CAPSULE INTO INHALER AND INHALE THE CONTENTS DAILY   . acetaminophen (TYLENOL) 325 MG tablet Take 650 mg by mouth every 6 (six) hours as needed. Reported on 08/20/2015   . ibuprofen (ADVIL,MOTRIN) 200 MG tablet Take 600 mg by mouth every 8 (eight) hours as needed for moderate pain.   . nitrofurantoin, macrocrystal-monohydrate,  (MACROBID) 100 MG capsule Take 1 capsule (100 mg total) by mouth 2 (two) times daily.    No facility-administered encounter medications on file as of 01/18/2016.    (macrobid rx'd today, not prior to visit).  Allergies  Allergen Reactions  . Codeine Other (See Comments)  . Penicillins Other (See Comments)  . Sulfa Antibiotics Other (See Comments)    Told as a child by her mother never to take  . Fish Allergy Diarrhea    Scaled fish- when pregnant ate some recently no problem   ROS: no fever, chills, flank pain, nausea, vomiting, URI symptoms, shortness of breath.  Some residual chest discomfort (related to breast cancer treatment).  Occasional loose stools.  Moods are good.  See HPI.  PHYSICAL EXAM:  BP 130/70 (BP Location: Left Arm, Patient Position: Sitting, Cuff Size: Normal)   Pulse 84   Temp 98.3 F (36.8 C) (Tympanic)   Ht 5\' 3"  (1.6 m)   Wt 103 lb 3.2 oz (46.8 kg)   BMI 18.28 kg/m   Thin, elderly female in no distress, in good spirits Heart: regular rate and rhythm without murmur Lungs: clear bilaterally Back: minimal tenderness at L flank, nontender on right Abdomen: soft, nontender Extremities: no edema  Urine dip: 3+ blood, trace protein, neg nitrite, 2+ leuks  ASSESSMENT/PLAN:  Acute cystitis without hematuria - Plan: Urine culture, nitrofurantoin, macrocrystal-monohydrate, (MACROBID) 100 MG capsule  Urinary frequency - Plan: POCT Urinalysis Dipstick  Burning with urination - Plan: POCT Urinalysis Dipstick    UTI--treat with macrobid x 7 days.  Send culture. Proper hygiene reviewed.  Take the antibiotic twice daily. Drink plenty of fluids. Always wipe front to back. Try and not hold your urine for extended periods of time. Contact us if your symptoms are significantly improved within 24-48 hours. Contact us if you develop fever, vomiting, or if you are having trouble tolerating the antibiotic.  Let Dr. Brien Few be aware of the infection--he may want to  delay any injection until it is cleared.

## 2016-01-19 ENCOUNTER — Encounter: Payer: Self-pay | Admitting: Family Medicine

## 2016-01-20 LAB — URINE CULTURE

## 2016-01-27 DIAGNOSIS — M5126 Other intervertebral disc displacement, lumbar region: Secondary | ICD-10-CM | POA: Diagnosis not present

## 2016-01-27 DIAGNOSIS — R030 Elevated blood-pressure reading, without diagnosis of hypertension: Secondary | ICD-10-CM | POA: Diagnosis not present

## 2016-02-15 DIAGNOSIS — H401131 Primary open-angle glaucoma, bilateral, mild stage: Secondary | ICD-10-CM | POA: Diagnosis not present

## 2016-05-18 DIAGNOSIS — H401131 Primary open-angle glaucoma, bilateral, mild stage: Secondary | ICD-10-CM | POA: Diagnosis not present

## 2016-06-07 NOTE — Progress Notes (Signed)
Chief Complaint  Patient presents with  . COPD    med check, nonfasting.    She reports some ongoing issues with cramps--in her foot, leg and hands.  They can occur throughout the day.  Occurs in the calves with change in position in bed. She admits to not drinking enough water.This is not new, has been going on for a while; denies worsening.  Last seen in October, treated for a UTI with macrodantin. Denies further urinary symptoms or problems.  Osteoporosis:  She was seen last March for discussion of osteoporosis and treatments.  This was prior to her diagnosis of breast cancer. At that time we discussed Evista and Prolia in detail, as well as bisophosphonates. Likely it was a bisphosphonate that she didn't tolerate in the past.  She was concerned about possibility of worsening leg cramps and recurrence of headaches with Evista. Since then she was diagnosed with breast cancer, and was started on Prolia through the cancer center.  She had her first Prolia injection in 12/2015. F/u due in March for next injection. She had mildly low vitamin D in 10/2014; still low at 28 in 82017. She is currently taking MVI and Vit D 1000 IU daily--she states she is better at taking it daily than she was prior to last test.  COPD: She states her breathing is good. She uses Spiriva daily without side effects. Spirometry in 05/2015 showed moderately severe obstruction.  Arnuity 175mg was added to her Spiriva at that time, as she had 3 months of spiriva (had just gotten it filled). This was changed to Flovent due to insurance reasons.  She was using 2 puffs once daily (rather than 1 puff BID as prescribed).  Denies thrush or side effects. Spirometry at her visit 6 months ago also showed moderately severe obstruction.  Only minimally improved, if at all, from last check, prior to being on inhaled steroids. Flovent dose was increased to 220 BID, along with staying on Spiriva.  She admits that she is still only doing one  puff daily--she can't remember to take it. She feels about the same as last visit--doesn't have problems on a daily basis, just short of breath when bringing the trash cans back up the driveway once a week. She hasn't needed any albuterol recently.  Doesn't get any regular exercise.  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 She is on anastrozole 1 mg daily, planned for 5 years.  Denies hot flashes or side effects.  [place], [place]  Outpatient Encounter Prescriptions as of 06/09/2016  Medication Sig Note  . anastrozole (ARIMIDEX) 1 MG tablet Take 1 tablet (1 mg total) by mouth daily.   .Marland Kitchenaspirin 81 MG tablet Take 81 mg by mouth daily.   . cholecalciferol (VITAMIN D) 1000 units tablet Take 1,000 Units by mouth daily.   .Marland Kitchendenosumab (PROLIA) 60 MG/ML SOLN injection Inject 60 mg into the skin every 6 (six) months. Administer in upper arm, thigh, or abdomen   . fluticasone (FLOVENT HFA) 110 MCG/ACT inhaler Inhale 2 puffs into the lungs 2 (two) times daily. Reported on 10/02/2015   . latanoprost (XALATAN) 0.005 % ophthalmic solution Place 1 drop into both eyes at bedtime.    . Multiple Vitamins-Minerals (ALIVE WOMENS 50+ PO) Take 1 tablet by mouth daily.   .Marland Kitchentiotropium (SPIRIVA HANDIHALER) 18 MCG inhalation capsule PLACE 1 CAPSULE INTO INHALER AND INHALE THE CONTENTS DAILY   . [  DISCONTINUED] fluticasone (FLOVENT HFA) 110 MCG/ACT inhaler Inhale 1 puff into the lungs 2 (two) times daily. Reported on 10/02/2015 06/09/2016: Currently taking just 1 puff once daily  . [DISCONTINUED] SPIRIVA HANDIHALER 18 MCG inhalation capsule PLACE 1 CAPSULE INTO INHALER AND INHALE THE CONTENTS DAILY   . acetaminophen (TYLENOL) 325 MG tablet Take 650 mg by mouth every 6 (six) hours as needed. Reported on 08/20/2015   . ibuprofen (ADVIL,MOTRIN) 200 MG tablet Take 600 mg by mouth every 8 (eight) hours as needed for moderate pain.   .  [DISCONTINUED] nitrofurantoin, macrocrystal-monohydrate, (MACROBID) 100 MG capsule Take 1 capsule (100 mg total) by mouth 2 (two) times daily.    No facility-administered encounter medications on file as of 06/09/2016.    Allergies  Allergen Reactions  . Codeine Other (See Comments)  . Penicillins Other (See Comments)  . Sulfa Antibiotics Other (See Comments)    Told as a child by her mother never to take  . Fish Allergy Diarrhea    Scaled fish- when pregnant ate some recently no problem   ROS: no fever, chills, URI symptoms, headaches, dizziness, chest pain, palpitations, GI or GU complaints. DOE per HPI (with trash cans, doesn't get other regular exercise).  Denies anorexia, weight changes, bleeding, bruising, rash, depression, hot flashes.  +cramps in hands/feet/legs per HPI  PHYSICAL EXAM:  BP (!) 142/74 (BP Location: Left Arm, Patient Position: Sitting, Cuff Size: Normal)   Pulse 80   Ht _0  (1.6 m)   Wt 104 lb 12.8 oz (47.5 kg)   BMI 18.56 kg/m   136/68 on repeat by MD  Well appearing, pleasant, thin, elderly female in no distress HEENT: conjunctiva and sclera are clear, OP clear Neck: no lymphadenopathy, thyromegaly or carotid bruit Heart: regular rate and rhythm. Occasional ectopic beats noted Lungs:  Breath sounds were fairly good throughout.  No wheezes, rales, ronchi Abdomen: soft, nontender Extremities: no edema Neuro: alert and oriented, cranial nerves intact. Normal gait Psych: normal mood, affect, hygiene and grooming  ASSESSMENT/PLAN:  Chronic obstructive pulmonary disease, unspecified COPD type ([place]) - minimally symptomatic due to minimal exercise/exertion. Encouraged 220 BID Flovent plus Spiriva, and start regular exercise. spirometry at f/u. - Plan: fluticasone (FLOVENT HFA) 110 MCG/ACT inhaler, tiotropium (SPIRIVA HANDIHALER) 18 MCG inhalation capsule  Osteoporosis without current pathological fracture, unspecified osteoporosis type - due soon for next  Prolia injection. Encouraged weight-bearing exercise, and compliance with Ca and Vitamin D  Vitamin D deficiency - Plan: VITAMIN D 25 Hydroxy (Vit-D Deficiency, Fractures)  Malignant neoplasm of upper-inner quadrant of right breast in female, estrogen receptor positive ([place]) - doing well, tolerating arimidex  Muscle cramps - encouraged adequate fluid intake - Plan: Basic metabolic panel  Medication monitoring encounter - Plan: Basic metabolic panel   Recheck vitamin D today. Check b-met as well (high Ca last time) Will have electrolytes for cramping also.  F/u 6 mos for AWV/med check+   Please try and get 150 minutes each week of aerobic exercise (ideally in at least 10-15 minute intervals--you might need to work your way up to that if limited by your breathing).  Taking your medications as directed (2 puffs twice daily of the flovent (fluticasone), remembering to rinse after use) will help your breathing improve so that you don't have as much shortness of breath with activity.  Try and get weight-bearing exercise at least 2-3 times/week--this is good for your bones. Consider looking into a Silver Sneakers program through [employer].  Please be sure to  drink at least 8 glasses of water daily (can be another non-caffeinated beverage).

## 2016-06-09 ENCOUNTER — Ambulatory Visit (INDEPENDENT_AMBULATORY_CARE_PROVIDER_SITE_OTHER): Payer: Medicare Other | Admitting: Family Medicine

## 2016-06-09 VITALS — BP 136/68 | HR 80 | Ht 63.0 in | Wt 104.8 lb

## 2016-06-09 DIAGNOSIS — J449 Chronic obstructive pulmonary disease, unspecified: Secondary | ICD-10-CM

## 2016-06-09 DIAGNOSIS — Z5181 Encounter for therapeutic drug level monitoring: Secondary | ICD-10-CM

## 2016-06-09 DIAGNOSIS — R252 Cramp and spasm: Secondary | ICD-10-CM

## 2016-06-09 DIAGNOSIS — E559 Vitamin D deficiency, unspecified: Secondary | ICD-10-CM

## 2016-06-09 DIAGNOSIS — C50211 Malignant neoplasm of upper-inner quadrant of right female breast: Secondary | ICD-10-CM | POA: Diagnosis not present

## 2016-06-09 DIAGNOSIS — M810 Age-related osteoporosis without current pathological fracture: Secondary | ICD-10-CM

## 2016-06-09 DIAGNOSIS — Z170 Estrogen receptor positive status [ER+]: Secondary | ICD-10-CM | POA: Diagnosis not present

## 2016-06-09 LAB — BASIC METABOLIC PANEL
BUN: 15 mg/dL (ref 7–25)
CO2: 27 mmol/L (ref 20–31)
CREATININE: 0.71 mg/dL (ref 0.60–0.93)
Calcium: 9.5 mg/dL (ref 8.6–10.4)
Chloride: 105 mmol/L (ref 98–110)
Glucose, Bld: 73 mg/dL (ref 65–99)
Potassium: 4.4 mmol/L (ref 3.5–5.3)
Sodium: 139 mmol/L (ref 135–146)

## 2016-06-09 MED ORDER — FLUTICASONE PROPIONATE HFA 110 MCG/ACT IN AERO: 2.0000 | INHALATION_SPRAY | Freq: Two times a day (BID) | RESPIRATORY_TRACT | 1 refills | Status: AC

## 2016-06-09 MED ORDER — TIOTROPIUM BROMIDE MONOHYDRATE 18 MCG IN CAPS: ORAL_CAPSULE | RESPIRATORY_TRACT | 1 refills | Status: AC

## 2016-06-09 NOTE — Patient Instructions (Addendum)
   Please try and get 150 minutes each week of aerobic exercise (ideally in at least 10-15 minute intervals--you might need to work your way up to that if limited by your breathing).  Taking your medications as directed (2 puffs twice daily of the flovent (fluticasone), remembering to rinse after use) will help your breathing improve so that you don't have as much shortness of breath with activity.  Try and get weight-bearing exercise at least 2-3 times/week--this is good for your bones. Consider looking into a Silver Sneakers program through [employer].  Please be sure to drink at least 8 glasses of water daily (can be another non-caffeinated beverage).  Your blood pressure was mildly elevated today (a little better on the recheck; it has been elevated intermittently in the past). Try and follow a low sodium diet. Regular exercise will keep the blood pressure down. Just be sure to continue to try and eat enough--I don't want the extra exercise to lead to any weight loss.

## 2016-06-10 ENCOUNTER — Encounter: Payer: Self-pay | Admitting: Family Medicine

## 2016-06-10 LAB — VITAMIN D 25 HYDROXY (VIT D DEFICIENCY, FRACTURES): VIT D 25 HYDROXY: 32 ng/mL (ref 30–100)

## 2016-06-16 ENCOUNTER — Telehealth: Payer: Self-pay | Admitting: Hematology and Oncology

## 2016-06-16 NOTE — Telephone Encounter (Signed)
Left a message on VM about ht appointment changes and a call back number for questions or concerns

## 2016-06-17 ENCOUNTER — Telehealth: Payer: Self-pay | Admitting: Hematology and Oncology

## 2016-06-17 NOTE — Telephone Encounter (Signed)
Patient called and needs to reschedule appointment for 3/14 she needs Wed or Thrusday in mid morning   Call back is [phone]

## 2016-06-21 ENCOUNTER — Other Ambulatory Visit: Payer: Self-pay

## 2016-06-21 DIAGNOSIS — C50211 Malignant neoplasm of upper-inner quadrant of right female breast: Secondary | ICD-10-CM

## 2016-06-21 DIAGNOSIS — Z170 Estrogen receptor positive status [ER+]: Principal | ICD-10-CM

## 2016-06-22 ENCOUNTER — Encounter: Payer: Self-pay | Admitting: Hematology and Oncology

## 2016-06-22 ENCOUNTER — Ambulatory Visit (HOSPITAL_BASED_OUTPATIENT_CLINIC_OR_DEPARTMENT_OTHER): Payer: Medicare Other | Admitting: Hematology and Oncology

## 2016-06-22 ENCOUNTER — Ambulatory Visit (HOSPITAL_BASED_OUTPATIENT_CLINIC_OR_DEPARTMENT_OTHER): Payer: Medicare Other

## 2016-06-22 ENCOUNTER — Telehealth: Payer: Self-pay | Admitting: Hematology and Oncology

## 2016-06-22 ENCOUNTER — Other Ambulatory Visit (HOSPITAL_BASED_OUTPATIENT_CLINIC_OR_DEPARTMENT_OTHER): Payer: Medicare Other

## 2016-06-22 DIAGNOSIS — M8580 Other specified disorders of bone density and structure, unspecified site: Secondary | ICD-10-CM | POA: Diagnosis not present

## 2016-06-22 DIAGNOSIS — C50211 Malignant neoplasm of upper-inner quadrant of right female breast: Secondary | ICD-10-CM

## 2016-06-22 DIAGNOSIS — Z170 Estrogen receptor positive status [ER+]: Secondary | ICD-10-CM

## 2016-06-22 LAB — COMPREHENSIVE METABOLIC PANEL
ALT: 16 U/L (ref 0–55)
AST: 18 U/L (ref 5–34)
Albumin: 3.7 g/dL (ref 3.5–5.0)
Alkaline Phosphatase: 75 U/L (ref 40–150)
Anion Gap: 8 mEq/L (ref 3–11)
BUN: 19.5 mg/dL (ref 7.0–26.0)
CHLORIDE: 107 meq/L (ref 98–109)
CO2: 28 meq/L (ref 22–29)
CREATININE: 0.8 mg/dL (ref 0.6–1.1)
Calcium: 9.5 mg/dL (ref 8.4–10.4)
EGFR: 69 mL/min/{1.73_m2} — ABNORMAL LOW (ref >90)
Glucose: 79 mg/dl (ref 70–140)
POTASSIUM: 4.3 meq/L (ref 3.5–5.1)
Sodium: 143 mEq/L (ref 136–145)
Total Bilirubin: 0.64 mg/dL (ref 0.20–1.20)
Total Protein: 6.7 g/dL (ref 6.4–8.3)

## 2016-06-22 LAB — CBC WITH DIFFERENTIAL/PLATELET
BASO%: 0.5 % (ref 0.0–2.0)
BASOS ABS: 0 10*3/uL (ref 0.0–0.1)
EOS ABS: 0.3 10*3/uL (ref 0.0–0.5)
EOS%: 5.4 % (ref 0.0–7.0)
HCT: 42.1 % (ref 34.8–46.6)
HGB: 14 g/dL (ref 11.6–15.9)
LYMPH%: 30.9 % (ref 14.0–49.7)
MCH: 29.1 pg (ref 25.1–34.0)
MCHC: 33.3 g/dL (ref 31.5–36.0)
MCV: 87.5 fL (ref 79.5–101.0)
MONO#: 0.5 10*3/uL (ref 0.1–0.9)
MONO%: 9.4 % (ref 0.0–14.0)
NEUT#: 3.1 10*3/uL (ref 1.5–6.5)
NEUT%: 53.8 % (ref 38.4–76.8)
PLATELETS: 181 10*3/uL (ref 145–400)
RBC: 4.81 10*6/uL (ref 3.70–5.45)
RDW: 14 % (ref 11.2–14.5)
WBC: 5.7 10*3/uL (ref 3.9–10.3)
lymph#: 1.8 10*3/uL (ref 0.9–3.3)

## 2016-06-22 MED ORDER — DENOSUMAB 60 MG/ML ~~LOC~~ SOLN
60.0000 mg | SUBCUTANEOUS | Status: DC
  Administered 2016-06-22: 60 mg via SUBCUTANEOUS
  Filled 2016-06-22: qty 1

## 2016-06-22 NOTE — Assessment & Plan Note (Signed)
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 No indication for chemotherapy because of final tumor size was less than 0.5 cm 2. Adjuvant radiation therapy 10/01/2015 to 10/29/2015 3. Adjuvant antiestrogen therapy with anastrozole 1 mg daily 5 years started 12/16/2015  Anastrozole toxicities:  Osteoporosis: On Prolia with calcium and vitamin D  Return to clinic in 6 months for follow-up

## 2016-06-22 NOTE — Telephone Encounter (Signed)
Appointments scheduled per 3/14 LOS. Patient given AVS report and calendars with future scheduled appointments.  °

## 2016-06-22 NOTE — Patient Instructions (Signed)
Denosumab injection  What is this medicine?  DENOSUMAB (den oh sue mab) slows bone breakdown. Prolia is used to treat osteoporosis in women after menopause and in men. Xgeva is used to prevent bone fractures and other bone problems caused by cancer bone metastases. Xgeva is also used to treat giant cell tumor of the bone.  This medicine may be used for other purposes; ask your health care provider or pharmacist if you have questions.  What should I tell my health care provider before I take this medicine?  They need to know if you have any of these conditions:  -dental disease  -eczema  -infection or history of infections  -kidney disease or on dialysis  -low blood calcium or vitamin D  -malabsorption syndrome  -scheduled to have surgery or tooth extraction  -taking medicine that contains denosumab  -thyroid or parathyroid disease  -an unusual reaction to denosumab, other medicines, foods, dyes, or preservatives  -pregnant or trying to get pregnant  -breast-feeding  How should I use this medicine?  This medicine is for injection under the skin. It is given by a health care professional in a hospital or clinic setting.  If you are getting Prolia, a special MedGuide will be given to you by the pharmacist with each prescription and refill. Be sure to read this information carefully each time.  For Prolia, talk to your pediatrician regarding the use of this medicine in children. Special care may be needed. For Xgeva, talk to your pediatrician regarding the use of this medicine in children. While this drug may be prescribed for children as young as 13 years for selected conditions, precautions do apply.  Overdosage: If you think you have taken too much of this medicine contact a poison control center or emergency room at once.  NOTE: This medicine is only for you. Do not share this medicine with others.  What if I miss a dose?  It is important not to miss your dose. Call your doctor or health care professional if you are  unable to keep an appointment.  What may interact with this medicine?  Do not take this medicine with any of the following medications:  -other medicines containing denosumab  This medicine may also interact with the following medications:  -medicines that suppress the immune system  -medicines that treat cancer  -steroid medicines like prednisone or cortisone  This list may not describe all possible interactions. Give your health care provider a list of all the medicines, herbs, non-prescription drugs, or dietary supplements you use. Also tell them if you smoke, drink alcohol, or use illegal drugs. Some items may interact with your medicine.  What should I watch for while using this medicine?  Visit your doctor or health care professional for regular checks on your progress. Your doctor or health care professional may order blood tests and other tests to see how you are doing.  Call your doctor or health care professional if you get a cold or other infection while receiving this medicine. Do not treat yourself. This medicine may decrease your body's ability to fight infection.  You should make sure you get enough calcium and vitamin D while you are taking this medicine, unless your doctor tells you not to. Discuss the foods you eat and the vitamins you take with your health care professional.  See your dentist regularly. Brush and floss your teeth as directed. Before you have any dental work done, tell your dentist you are receiving this medicine.  Do   not become pregnant while taking this medicine or for 5 months after stopping it. Women should inform their doctor if they wish to become pregnant or think they might be pregnant. There is a potential for serious side effects to an unborn child. Talk to your health care professional or pharmacist for more information.  What side effects may I notice from receiving this medicine?  Side effects that you should report to your doctor or health care professional as soon as  possible:  -allergic reactions like skin rash, itching or hives, swelling of the face, lips, or tongue  -breathing problems  -chest pain  -fast, irregular heartbeat  -feeling faint or lightheaded, falls  -fever, chills, or any other sign of infection  -muscle spasms, tightening, or twitches  -numbness or tingling  -skin blisters or bumps, or is dry, peels, or red  -slow healing or unexplained pain in the mouth or jaw  -unusual bleeding or bruising  Side effects that usually do not require medical attention (Report these to your doctor or health care professional if they continue or are bothersome.):  -muscle pain  -stomach upset, gas  This list may not describe all possible side effects. Call your doctor for medical advice about side effects. You may report side effects to FDA at 1-800-FDA-1088.  Where should I keep my medicine?  This medicine is only given in a clinic, doctor's office, or other health care setting and will not be stored at home.  NOTE: This sheet is a summary. It may not cover all possible information. If you have questions about this medicine, talk to your doctor, pharmacist, or health care provider.      2016, Elsevier/Gold Standard. (2011-09-26 12:37:47)

## 2016-06-22 NOTE — Progress Notes (Signed)
Patient Care Team: Rita Ohara, MD as PCP - General (Family Medicine)  DIAGNOSIS:  Encounter Diagnosis  Name Primary?  . Malignant neoplasm of upper-inner quadrant of right breast in female, estrogen receptor positive ([place])     SUMMARY OF ONCOLOGIC HISTORY:   Breast cancer of upper-inner quadrant of right female breast ([place])   06/30/2015 Imaging    DEXA scan: Osteoporosis (T-score -3.1)      08/06/2015 Mammogram    Right breast mammogram and ultrasound revealed asymmetry posteriorly 4 x 4 x 3 mm, axilla negative,Tis N0 stage 0      08/06/2015 Initial Diagnosis    Right breast biopsy 2:00 position goal DCIS with necrosis intermediate grade,ER 100%, PR 85%,?foci of early stromal invasion      09/01/2015 Surgery    Right lumpectomy [facility]): IDC grade 2, 0.4 cm, intermediate grade DCIS, lymph/vascular invasion identified, margins negative, ER 100%, PR 40%, Ki-67 10%, HER-2 negative (ratio 1.25).  pT1a,pNx      10/01/2015 - 10/30/2015 Radiation Therapy    Adjuvant radiation therapy Lisbeth Renshaw). The Right breast was treated to 42.5 Gy in 17 fractions at 2.5 Gy per fraction. Right breast was boosted to 7.5 Gy in 3 fractions at 2.5 Gy per fraction.       12/16/2015 -  Anti-estrogen oral therapy    Anastrozole 1 mg daily. Planned duration of therapy: 5 years.        CHIEF COMPLIANT: Follow-up on anastrozole therapy  INTERVAL HISTORY: Kelsey Fuller is a 76 year old with above-mentioned history right breast cancer treated with lumpectomy radiation is currently on anastrozole therapy since September 2017. She denies any hot flashes or myalgias.  REVIEW OF SYSTEMS:   Constitutional: Denies fevers, chills or abnormal weight loss Eyes: Denies blurriness of vision Ears, nose, mouth, throat, and face: Denies mucositis or sore throat Respiratory: Denies cough, dyspnea or wheezes Cardiovascular: Denies palpitation, chest discomfort Gastrointestinal:  Denies nausea, heartburn or change in bowel  habits Skin: Denies abnormal skin rashes Lymphatics: Denies new lymphadenopathy or easy bruising Neurological:Denies numbness, tingling or new weaknesses Behavioral/Psych: Mood is stable, no new changes  Extremities: No lower extremity edema Breast: Mild discomfort in the right breast and chest wall to palpation. All other systems were reviewed with the patient and are negative.  I have reviewed the past medical history, past surgical history, social history and family history with the patient and they are unchanged from previous note.  ALLERGIES:  is allergic to codeine; penicillins; sulfa antibiotics; and fish allergy.  MEDICATIONS:  Current Outpatient Prescriptions  Medication Sig Dispense Refill  . acetaminophen (TYLENOL) 325 MG tablet Take 650 mg by mouth every 6 (six) hours as needed. Reported on 08/20/2015    . anastrozole (ARIMIDEX) 1 MG tablet Take 1 tablet (1 mg total) by mouth daily. 90 tablet 3  . aspirin 81 MG tablet Take 81 mg by mouth daily.    . cholecalciferol (VITAMIN D) 1000 units tablet Take 1,000 Units by mouth daily.    Marland Kitchen denosumab (PROLIA) 60 MG/ML SOLN injection Inject 60 mg into the skin every 6 (six) months. Administer in upper arm, thigh, or abdomen    . fluticasone (FLOVENT HFA) 110 MCG/ACT inhaler Inhale 2 puffs into the lungs 2 (two) times daily. Reported on 10/02/2015 3 Inhaler 1  . ibuprofen (ADVIL,MOTRIN) 200 MG tablet Take 600 mg by mouth every 8 (eight) hours as needed for moderate pain.    Marland Kitchen latanoprost (XALATAN) 0.005 % ophthalmic solution Place 1 drop into both  eyes at bedtime.   3  . Multiple Vitamins-Minerals (ALIVE WOMENS 50+ PO) Take 1 tablet by mouth daily.    Marland Kitchen tiotropium (SPIRIVA HANDIHALER) 18 MCG inhalation capsule PLACE 1 CAPSULE INTO INHALER AND INHALE THE CONTENTS DAILY 90 capsule 1   No current facility-administered medications for this visit.     PHYSICAL EXAMINATION: ECOG PERFORMANCE STATUS: 1 - Symptomatic but completely  ambulatory  Vitals:   06/22/16 1454  BP: (!) 147/93  Pulse: 91  Resp: 17  Temp: 97.9 F (36.6 C)   Filed Weights   06/22/16 1454  Weight: 103 lb 12.8 oz (47.1 kg)    GENERAL:alert, no distress and comfortable SKIN: skin color, texture, turgor are normal, no rashes or significant lesions EYES: normal, Conjunctiva are pink and non-injected, sclera clear OROPHARYNX:no exudate, no erythema and lips, buccal mucosa, and tongue normal  NECK: supple, thyroid normal size, non-tender, without nodularity LYMPH:  no palpable lymphadenopathy in the cervical, axillary or inguinal LUNGS: clear to auscultation and percussion with normal breathing effort HEART: regular rate & rhythm and no murmurs and no lower extremity edema ABDOMEN:abdomen soft, non-tender and normal bowel sounds MUSCULOSKELETAL:no cyanosis of digits and no clubbing  NEURO: alert & oriented x 3 with fluent speech, no focal motor/sensory deficits EXTREMITIES: No lower extremity edema  LABORATORY DATA:  I have reviewed the data as listed   Chemistry      Component Value Date/Time   NA 139 06/09/2016 1148   NA 143 12/22/2015 1306   K 4.4 06/09/2016 1148   K 4.2 12/22/2015 1306   CL 105 06/09/2016 1148   CO2 27 06/09/2016 1148   CO2 27 12/22/2015 1306   BUN 15 06/09/2016 1148   BUN 21.1 12/22/2015 1306   CREATININE 0.71 06/09/2016 1148   CREATININE 0.8 12/22/2015 1306      Component Value Date/Time   CALCIUM 9.5 06/09/2016 1148   CALCIUM 10.6 (H) 12/22/2015 1306   ALKPHOS 88 12/22/2015 1306   AST 19 12/22/2015 1306   ALT 17 12/22/2015 1306   BILITOT 0.43 12/22/2015 1306       Lab Results  Component Value Date   WBC 5.7 06/22/2016   HGB 14.0 06/22/2016   HCT 42.1 06/22/2016   MCV 87.5 06/22/2016   PLT 181 06/22/2016   NEUTROABS 3.1 06/22/2016    ASSESSMENT & PLAN:  Breast cancer of upper-inner quadrant of right female breast ([facility]) Right lumpectomy 09/01/2015: IDC grade 2, 0.4 cm, IgG DCIS,  lymph/vascular invasion identified, margins negative, ER positive, PR 40%, Ki-67 10%, HER-2 negative duration 1.25  Treatment summary: 1. No indication for chemotherapy because of final tumor size was less than 0.5 cm 2. Adjuvant radiation therapy 10/01/2015 to 10/29/2015 3. Adjuvant antiestrogen therapy with anastrozole 1 mg daily 5 years started 12/16/2015  Anastrozole toxicities: Denies any hot flashes or myalgias. Osteoporosis: On Prolia with calcium and vitamin D. She'll receive a dose of Prolia today.  Return to clinic in 6 months for follow-up   I spent 25 minutes talking to the patient of which more than half was spent in counseling and coordination of care.  No orders of the defined types were placed in this encounter.  The patient has a good understanding of the overall plan. she agrees with it. she will call with any problems that may develop before the next visit here.   Rulon Eisenmenger, MD 06/22/16

## 2016-07-04 ENCOUNTER — Ambulatory Visit (INDEPENDENT_AMBULATORY_CARE_PROVIDER_SITE_OTHER): Payer: Medicare Other | Admitting: Family Medicine

## 2016-07-04 ENCOUNTER — Encounter: Payer: Self-pay | Admitting: Family Medicine

## 2016-07-04 VITALS — BP 148/80 | HR 84 | Temp 100.0°F | Ht 63.0 in | Wt 103.6 lb

## 2016-07-04 DIAGNOSIS — N3000 Acute cystitis without hematuria: Secondary | ICD-10-CM | POA: Diagnosis not present

## 2016-07-04 DIAGNOSIS — R350 Frequency of micturition: Secondary | ICD-10-CM

## 2016-07-04 DIAGNOSIS — R300 Dysuria: Secondary | ICD-10-CM

## 2016-07-04 LAB — POCT URINALYSIS DIPSTICK
GLUCOSE UA: NEGATIVE
SPEC GRAV UA: 1.025 (ref 1.030–1.035)
UROBILINOGEN UA: 1 (ref ?–2.0)
pH, UA: 6 (ref 5.0–8.0)

## 2016-07-04 MED ORDER — NITROFURANTOIN MONOHYD MACRO 100 MG PO CAPS: 100.0000 mg | ORAL_CAPSULE | Freq: Two times a day (BID) | ORAL | 0 refills | Status: DC

## 2016-07-04 NOTE — Progress Notes (Signed)
Chief Complaint  Patient presents with  . Urinary Frequency    and burning that started yesterday.    Yesterday morning she developed pain with voiding, along with frequency and urgency.  Urine is cloudy, bad odor. She noticed slightly pink/bloody residue on the toilet paper with wiping.  Urine hasn't been noted to be bloody.  She took Azo yesterday, none today, but urine is still orange today (see urine dip results).  She admits perhaps she hasn't been drinking enough water since it became painful.  Denies fever, chills.  Just feeling overall fatigued.  Denies flank pain, sometimes just when driving.  She is having some low back pain.  Intermittent discomfort in lower abdomen.    Last UTI was in October 2017.  It seems to hurt more now than with the last infection.  Last culture showed E.coli, sensitive to all.  PMH, PSH, SH reviewed  Outpatient Encounter Prescriptions as of 07/04/2016  Medication Sig  . anastrozole (ARIMIDEX) 1 MG tablet Take 1 tablet (1 mg total) by mouth daily.  Marland Kitchen aspirin 81 MG tablet Take 81 mg by mouth daily.  . cholecalciferol (VITAMIN D) 1000 units tablet Take 1,000 Units by mouth daily.  Marland Kitchen denosumab (PROLIA) 60 MG/ML SOLN injection Inject 60 mg into the skin every 6 (six) months. Administer in upper arm, thigh, or abdomen  . fluticasone (FLOVENT HFA) 110 MCG/ACT inhaler Inhale 2 puffs into the lungs 2 (two) times daily. Reported on 10/02/2015  . latanoprost (XALATAN) 0.005 % ophthalmic solution Place 1 drop into both eyes at bedtime.   . Multiple Vitamins-Minerals (ALIVE WOMENS 50+ PO) Take 1 tablet by mouth daily.  Marland Kitchen tiotropium (SPIRIVA HANDIHALER) 18 MCG inhalation capsule PLACE 1 CAPSULE INTO INHALER AND INHALE THE CONTENTS DAILY  . acetaminophen (TYLENOL) 325 MG tablet Take 650 mg by mouth every 6 (six) hours as needed. Reported on 08/20/2015  . ibuprofen (ADVIL,MOTRIN) 200 MG tablet Take 600 mg by mouth every 8 (eight) hours as needed for moderate pain.   No  facility-administered encounter medications on file as of 07/04/2016.    Allergies  Allergen Reactions  . Codeine Other (See Comments)  . Penicillins Other (See Comments)  . Sulfa Antibiotics Other (See Comments)    Told as a child by her mother never to take  . Fish Allergy Diarrhea    Scaled fish- when pregnant ate some recently no problem    ROS: no fever, chills, dizziness.  Some headache related to sinuses/allergies.  Denies chest pain, palpitations. DOE with garbage up the hill unchanged.  No bleeding, bruising, rashes.  PHYSICAL EXAM:  BP (!) 148/80 (BP Location: Left Arm, Patient Position: Sitting, Cuff Size: Normal)   Pulse 84   Temp 100 F (37.8 C) (Tympanic)   Ht 5\' 3"  (1.6 m)   Wt 103 lb 9.6 oz (47 kg)   BMI 18.35 kg/m    Well developed, thin female in no distress Back: no CVA tenderness Heart: regular rate and rhythm Lungs: no rales, wheezes. Some coarse breath sounds at bases, L>R Abdomen: slight discomfort suprapubically, otherwise nontender Extremities: no edema  Urine: orange in color, affecting the dipstick  ASSESSMENT/PLAN:  Urinary frequency - Plan: POCT Urinalysis Dipstick  Burning with urination - Plan: POCT Urinalysis Dipstick  Acute cystitis without hematuria - Plan: nitrofurantoin, macrocrystal-monohydrate, (MACROBID) 100 MG capsule, Urine culture   Treat with macrobid antibiotic twice daily. You may use the azo today if needed for discomfort, as well as tylenol as needed. Contact us  in 48 hours if your symptoms aren't improving, sooner if worse (flank pain, high fevers, vomiting). We will be in touch with your culture results when available.

## 2016-07-04 NOTE — Patient Instructions (Signed)
Treat with macrobid antibiotic twice daily. You may use the azo today if needed for discomfort, as well as tylenol as needed. Contact us in 48 hours if your symptoms aren't improving, sooner if worse (flank pain, high fevers, vomiting). We will be in touch with your culture results when available.   Urinary Tract Infection, Adult A urinary tract infection (UTI) is an infection of any part of the urinary tract, which includes the kidneys, ureters, bladder, and urethra. These organs make, store, and get rid of urine in the body. UTI can be a bladder infection (cystitis) or kidney infection (pyelonephritis). What are the causes? This infection may be caused by fungi, viruses, or bacteria. Bacteria are the most common cause of UTIs. This condition can also be caused by repeated incomplete emptying of the bladder during urination. What increases the risk? This condition is more likely to develop if:  You ignore your need to urinate or hold urine for long periods of time.  You do not empty your bladder completely during urination.  You wipe back to front after urinating or having a bowel movement, if you are female.  You are uncircumcised, if you are female.  You are constipated.  You have a urinary catheter that stays in place (indwelling).  You have a weak defense (immune) system.  You have a medical condition that affects your bowels, kidneys, or bladder.  You have diabetes.  You take antibiotic medicines frequently or for long periods of time, and the antibiotics no longer work well against certain types of infections (antibiotic resistance).  You take medicines that irritate your urinary tract.  You are exposed to chemicals that irritate your urinary tract.  You are female. What are the signs or symptoms? Symptoms of this condition include:  Fever.  Frequent urination or passing small amounts of urine frequently.  Needing to urinate urgently.  Pain or burning with  urination.  Urine that smells bad or unusual.  Cloudy urine.  Pain in the lower abdomen or back.  Trouble urinating.  Blood in the urine.  Vomiting or being less hungry than normal.  Diarrhea or abdominal pain.  Vaginal discharge, if you are female. How is this diagnosed? This condition is diagnosed with a medical history and physical exam. You will also need to provide a urine sample to test your urine. Other tests may be done, including:  Blood tests.  Sexually transmitted disease (STD) testing. If you have had more than one UTI, a cystoscopy or imaging studies may be done to determine the cause of the infections. How is this treated? Treatment for this condition often includes a combination of two or more of the following:  Antibiotic medicine.  Other medicines to treat less common causes of UTI.  Over-the-counter medicines to treat pain.  Drinking enough water to stay hydrated. Follow these instructions at home:  Take over-the-counter and prescription medicines only as told by your health care provider.  If you were prescribed an antibiotic, take it as told by your health care provider. Do not stop taking the antibiotic even if you start to feel better.  Avoid alcohol, caffeine, tea, and carbonated beverages. They can irritate your bladder.  Drink enough fluid to keep your urine clear or pale yellow.  Keep all follow-up visits as told by your health care provider. This is important.  Make sure to:  Empty your bladder often and completely. Do not hold urine for long periods of time.  Empty your bladder before and after  sex.  Wipe from front to back after a bowel movement if you are female. Use each tissue one time when you wipe. Contact a health care provider if:  You have back pain.  You have a fever.  You feel nauseous or vomit.  Your symptoms do not get better after 3 days.  Your symptoms go away and then return. Get help right away if:  You  have severe back pain or lower abdominal pain.  You are vomiting and cannot keep down any medicines or water. This information is not intended to replace advice given to you by your health care provider. Make sure you discuss any questions you have with your health care provider. Document Released: 01/05/2005 Document Revised: 09/09/2015 Document Reviewed: 02/16/2015 Elsevier Interactive Patient Education  2017 [employer].

## 2016-07-06 LAB — URINE CULTURE

## 2016-08-16 ENCOUNTER — Ambulatory Visit (INDEPENDENT_AMBULATORY_CARE_PROVIDER_SITE_OTHER): Payer: Medicare Other | Admitting: Family Medicine

## 2016-08-16 VITALS — BP 110/70 | HR 83 | Wt 103.0 lb

## 2016-08-16 DIAGNOSIS — N3001 Acute cystitis with hematuria: Secondary | ICD-10-CM

## 2016-08-16 DIAGNOSIS — R350 Frequency of micturition: Secondary | ICD-10-CM

## 2016-08-16 DIAGNOSIS — W57XXXA Bitten or stung by nonvenomous insect and other nonvenomous arthropods, initial encounter: Secondary | ICD-10-CM | POA: Diagnosis not present

## 2016-08-16 LAB — POCT URINALYSIS DIPSTICK
BILIRUBIN UA: NEGATIVE
Glucose, UA: NEGATIVE
KETONES UA: NEGATIVE
NITRITE UA: NEGATIVE
PH UA: 6 (ref 5.0–8.0)
Protein, UA: POSITIVE
RBC UA: POSITIVE
Spec Grav, UA: 1.015 (ref 1.010–1.025)
Urobilinogen, UA: NEGATIVE E.U./dL — AB

## 2016-08-16 MED ORDER — CIPROFLOXACIN HCL 500 MG PO TABS: 500.0000 mg | ORAL_TABLET | Freq: Two times a day (BID) | ORAL | 0 refills | Status: DC

## 2016-08-16 NOTE — Progress Notes (Signed)
Subjective:     Patient ID: Kelsey Fuller, female   DOB: 01/30/1941, 76 y.o.   MRN: [id number]  HPI Kelsey Fuller is a 76 year old female who presents today with concerns of urinary frequency as well as a recent tick bite.  Urinary Frequency The patient reports a 5 day history of urinary frequency, burning, and pressure. Sunday morning she noticed blood in her urine twice. She reports no back pain, fevers, chills, nausea, vomiting, or radiating pain. She's tried drinking more water and tylenol for the pain, but this hasn't helped. She has a history of frequent UTIs and her last one was about 5 weeks ago. It was treated with macrobid at that time.   Tick Bite She noticed a tick on her buttocks this morning. She is unsure how long it was there. It was not difficult to remove. She said she was outside yesterday and could have gotten the tick bite at that time. It is not itchy and there is not a rash in the area. She used hydrogen peroxide on the area. She has no rashes, headaches, or fevers. She brought the tick in and it was about 3 millimeters in size.  Review of Systems Pertinent positives and negatives included in the subjective above.    Objective:   Physical Exam Patient was alert and in no distress.  UA was performed and showed blood in the urine and leukocytes.    Assessment and Plan:     Frequent urination - Plan: POCT Urinalysis Dipstick  Tick bite, initial encounter  Acute cystitis with hematuria - Plan: ciprofloxacin (CIPRO) 500 MG tablet, Urine culture  1. Acute cystitis with hematuria: Patient's presentation and UA showed an infection. We will treat with ciprofloxacin given her recent UTI. We will have her follow up in 10 days to recheck her urine. We will also do a urine culture to see if there is any resistance. 2. Tick bite: The tick was easy to remove and was not engorged. This is not concerning and we encouraged her to return if she has any symptoms or rashes from the  bite.  Patient was seen in conjunction with Dr. Redmond School. [place], MS3

## 2016-08-17 DIAGNOSIS — H401131 Primary open-angle glaucoma, bilateral, mild stage: Secondary | ICD-10-CM | POA: Diagnosis not present

## 2016-08-18 LAB — URINE CULTURE

## 2016-08-31 ENCOUNTER — Encounter: Payer: Medicare Other | Admitting: Family Medicine

## 2016-08-31 NOTE — Progress Notes (Signed)
Chief Complaint  Patient presents with  . Follow-up    2 week follow up on UTI, finished Cipro 7 days ago and thinks symptoms are better-no burning or pressure. Does mention that about 2-3 days into abx she started feeling weak and stil feels that way. Was also seen for tick bite two days ago.     Patient presents for follow up on recent cystitis. She saw Dr. Redmond School on 5/8, and was put on cipro.  She is here for recheck of urine. She states that her symptoms resolved with the course of cipro.  Urine culture showed: Culture ENTEROCOCCUS SPECIES   Colony Count Greater than 100,000 CFU/mL   Organism ID, Bacteria ENTEROCOCCUS SPECIES   Resulting Agency [facility]  Susceptibility    Enterococcus species    Not Specified    AMPICILLIN <=2 "><=2  Sensitive    LEVOFLOXACIN 0.5  Sensitive    NITROFURANTOIN <=16 "><=16  Sensitive    TETRACYCLINE <=1 "><=1  Sensitive    VANCOMYCIN 2  Sensitive       She is complaining of feeling achey since starting the cipro, started a few days into the antibiotic course.  Has some achiness throughout pelvis, hips, knees.   No change in activity, injury, fall.  PMH, PSH, SH reviewed  Outpatient Encounter Prescriptions as of 09/01/2016  Medication Sig  . anastrozole (ARIMIDEX) 1 MG tablet Take 1 tablet (1 mg total) by mouth daily.  Marland Kitchen aspirin 81 MG tablet Take 81 mg by mouth daily.  . cholecalciferol (VITAMIN D) 1000 units tablet Take 1,000 Units by mouth daily.  Marland Kitchen denosumab (PROLIA) 60 MG/ML SOLN injection Inject 60 mg into the skin every 6 (six) months. Administer in upper arm, thigh, or abdomen  . fluticasone (FLOVENT HFA) 110 MCG/ACT inhaler Inhale 2 puffs into the lungs 2 (two) times daily. Reported on 10/02/2015  . latanoprost (XALATAN) 0.005 % ophthalmic solution Place 1 drop into both eyes at bedtime.   . Multiple Vitamins-Minerals (ALIVE WOMENS 50+ PO) Take 1 tablet by mouth daily.  Marland Kitchen tiotropium (SPIRIVA HANDIHALER) 18 MCG inhalation capsule PLACE 1  CAPSULE INTO INHALER AND INHALE THE CONTENTS DAILY  . acetaminophen (TYLENOL) 325 MG tablet Take 650 mg by mouth every 6 (six) hours as needed. Reported on 08/20/2015  . ibuprofen (ADVIL,MOTRIN) 200 MG tablet Take 600 mg by mouth every 8 (eight) hours as needed for moderate pain.  . [DISCONTINUED] ciprofloxacin (CIPRO) 500 MG tablet Take 1 tablet (500 mg total) by mouth 2 (two) times daily. (Patient not taking: Reported on 09/01/2016)  . [DISCONTINUED] nitrofurantoin, macrocrystal-monohydrate, (MACROBID) 100 MG capsule Take 1 capsule (100 mg total) by mouth 2 (two) times daily. (Patient not taking: Reported on 08/16/2016)   No facility-administered encounter medications on file as of 09/01/2016.    Allergies  Allergen Reactions  . Codeine Other (See Comments)  . Penicillins Other (See Comments)  . Sulfa Antibiotics Other (See Comments)    Told as a child by her mother never to take  . Fish Allergy Diarrhea    Scaled fish- when pregnant ate some recently no problem   ROS: no fever, chills, headache, dizziness, nausea, vomiting, back or abdominal pain, urinary symptoms ,bleeding, bruising, rash.  Just some achiness in hips/pelvix per HPI.  PHYSICAL EXAM:  BP 120/70 (BP Location: Left Arm, Patient Position: Sitting, Cuff Size: Normal)   Pulse 92   Temp 98.6 F (37 C) (Tympanic)   Ht 5\' 3"  (1.6 m)   Wt 103 lb 9.6  oz (47 kg)   BMI 18.35 kg/m   Well appearing ,pleasant female in no distress Back: no CVA tenderness. No spinal tenderness. Mildly tender at her right SI joint.  No pyriformis spasm. Normal strength, ROM, DTR's, gait, sensation Abdomen: nontender Skin: very small area on right buttock from recent tick bite--no rash, tenderness, induration.  Urine dip: Trace protein, trace blood   ASSESSMENT/PLAN:  Recurrent UTI - symptoms ccompletely resolved, and urine is now clear.  Reassured  Dysuria - Plan: POCT Urinalysis Dipstick  Arthralgia of hip, unspecified laterality - mildly  tender at R SI joint.  Encouraged strengthening, stretching, may have some underlying OA.    UTI--resolved, reassured. Some mild stiffness/joint pains--reassured.  F/u as scheduled in October

## 2016-09-01 ENCOUNTER — Ambulatory Visit (INDEPENDENT_AMBULATORY_CARE_PROVIDER_SITE_OTHER): Payer: Medicare Other | Admitting: Family Medicine

## 2016-09-01 ENCOUNTER — Encounter: Payer: Self-pay | Admitting: Family Medicine

## 2016-09-01 VITALS — BP 120/70 | HR 92 | Temp 98.6°F | Ht 63.0 in | Wt 103.6 lb

## 2016-09-01 DIAGNOSIS — R300 Dysuria: Secondary | ICD-10-CM

## 2016-09-01 DIAGNOSIS — M25559 Pain in unspecified hip: Secondary | ICD-10-CM

## 2016-09-01 DIAGNOSIS — N390 Urinary tract infection, site not specified: Secondary | ICD-10-CM | POA: Diagnosis not present

## 2016-09-01 LAB — POCT URINALYSIS DIPSTICK
BILIRUBIN UA: NEGATIVE
GLUCOSE UA: NEGATIVE
Ketones, UA: NEGATIVE
Leukocytes, UA: NEGATIVE
Nitrite, UA: NEGATIVE
SPEC GRAV UA: 1.015 (ref 1.010–1.025)
Urobilinogen, UA: NEGATIVE E.U./dL — AB
pH, UA: 6 (ref 5.0–8.0)

## 2016-09-02 ENCOUNTER — Encounter: Payer: Self-pay | Admitting: Family Medicine

## 2016-10-17 IMAGING — CR DG LUMBAR SPINE COMPLETE 4+V
5 series · 5 of 5 positions shown · non-contrast
Comparison: No recent prior.

CLINICAL DATA: Back pain with radiation into buttocks. No known
injury.

EXAM:
LUMBAR SPINE - COMPLETE 4+ VIEW

[t l-spine a.p.]
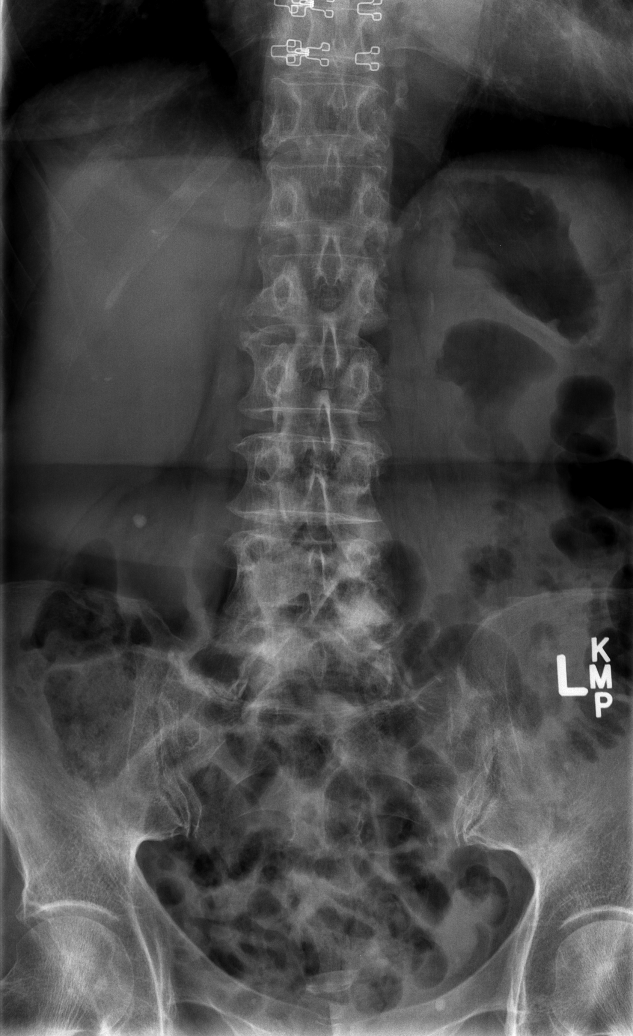

[t l-spine oblique exposure (1 of 2)]
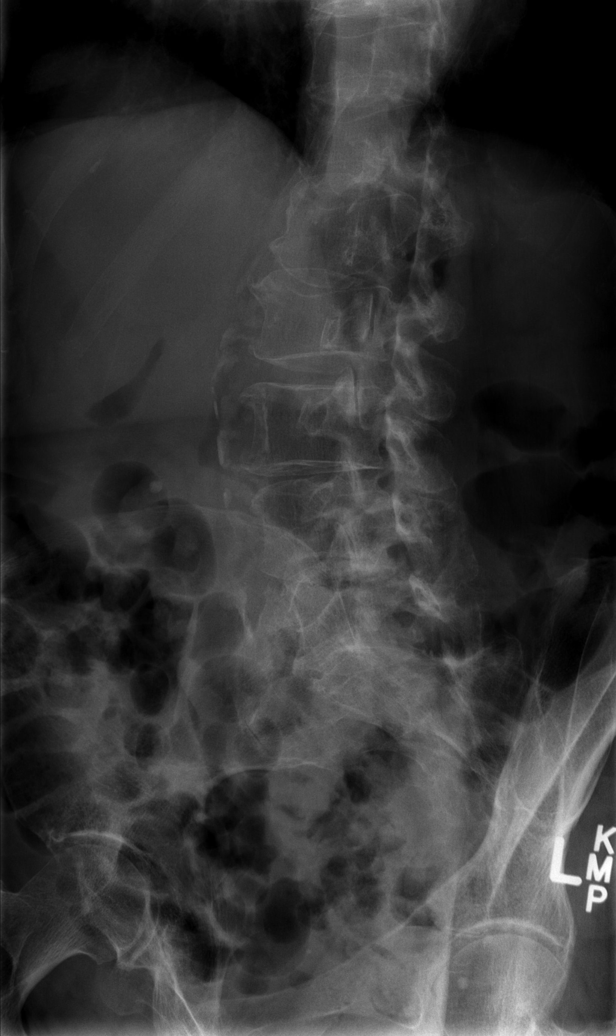

[t l-spine oblique exposure (2 of 2)]
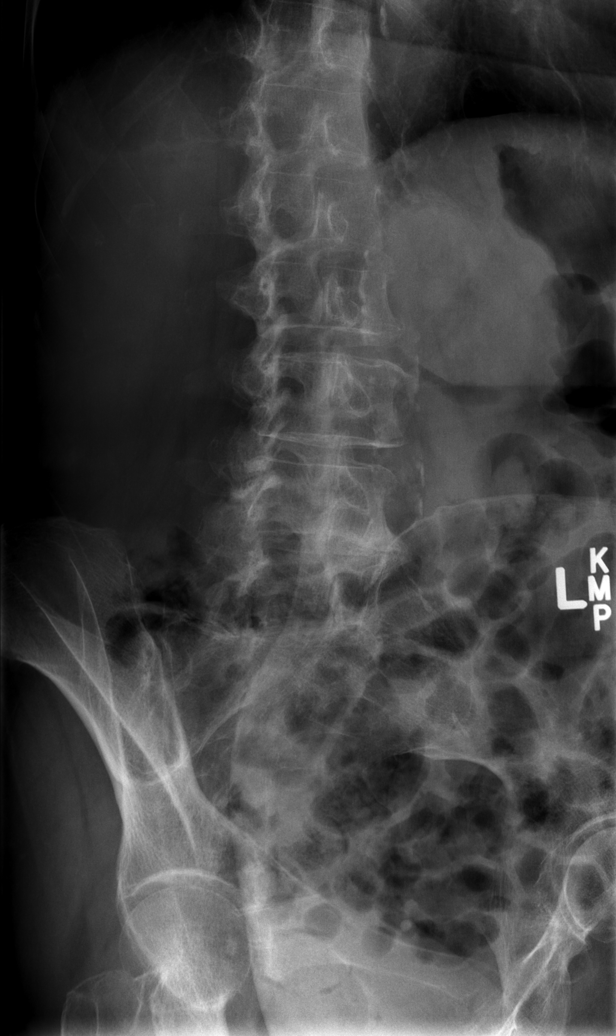

[t l-spine lat]
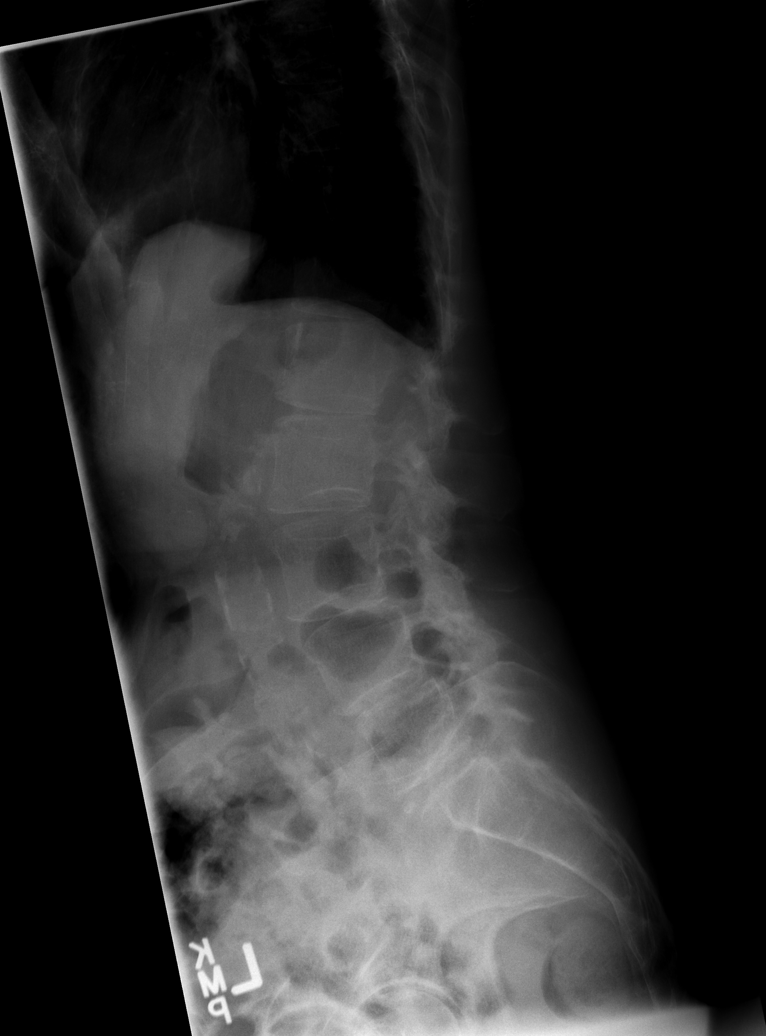

[t l-spine l5-s1 spot]
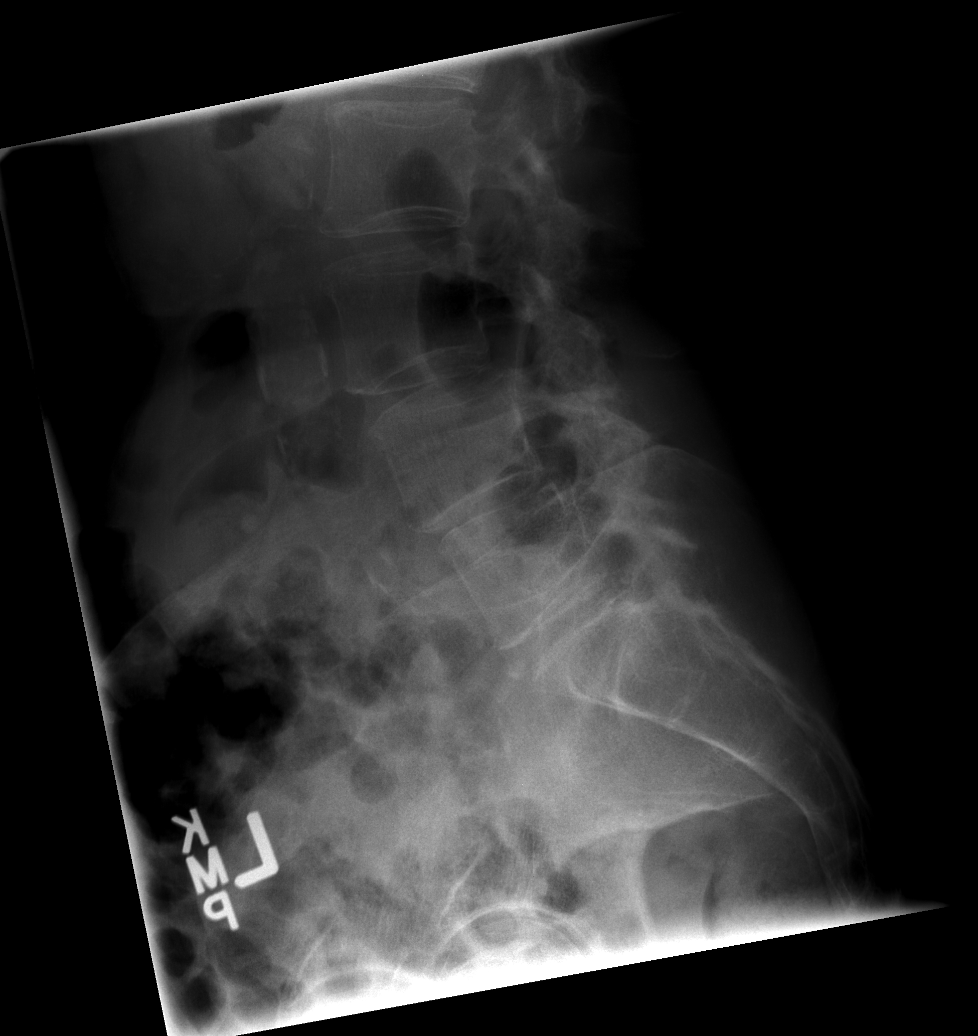

[5 of 5 positions shown; findings below may reference images not displayed]

FINDINGS: Calcific density noted projected over the right kidney suggesting
right nephrolithiasis. Air-filled loops of small and large bowel
noted. Mild adynamic ileus cannot be excluded. Diffuse osteopenia
and degenerative change. 2 mm anterolisthesis L3 on L4 and L4 on L5.
No acute bony abnormality identified.
IMPRESSION: 1. Diffuse osteopenia degenerative change. 2 mm anterolisthesis L3
on L4 and L4 on L5.No acute bony abnormality identified. No evidence
of fracture.

2.  Mild adynamic ileus cannot be excluded.

3.  Right nephrolithiasis cannot be excluded.

4. Aortoiliac atherosclerotic vascular disease.

## 2016-10-25 ENCOUNTER — Encounter: Payer: Self-pay | Admitting: Family Medicine

## 2016-10-25 ENCOUNTER — Ambulatory Visit (INDEPENDENT_AMBULATORY_CARE_PROVIDER_SITE_OTHER): Payer: Medicare Other | Admitting: Family Medicine

## 2016-10-25 VITALS — BP 130/76 | HR 92 | Temp 98.3°F | Ht 63.0 in | Wt 104.2 lb

## 2016-10-25 DIAGNOSIS — R350 Frequency of micturition: Secondary | ICD-10-CM | POA: Diagnosis not present

## 2016-10-25 DIAGNOSIS — N390 Urinary tract infection, site not specified: Secondary | ICD-10-CM | POA: Diagnosis not present

## 2016-10-25 DIAGNOSIS — N3000 Acute cystitis without hematuria: Secondary | ICD-10-CM | POA: Diagnosis not present

## 2016-10-25 LAB — POCT URINALYSIS DIP (PROADVANTAGE DEVICE)
Bilirubin, UA: NEGATIVE
GLUCOSE UA: NEGATIVE mg/dL
Ketones, POC UA: NEGATIVE mg/dL
NITRITE UA: NEGATIVE
Protein Ur, POC: NEGATIVE mg/dL
Specific Gravity, Urine: 1.01
UUROB: NEGATIVE
pH, UA: 6 (ref 5.0–8.0)

## 2016-10-25 MED ORDER — NITROFURANTOIN MONOHYD MACRO 100 MG PO CAPS: 100.0000 mg | ORAL_CAPSULE | Freq: Two times a day (BID) | ORAL | 0 refills | Status: AC

## 2016-10-25 NOTE — Patient Instructions (Signed)
Given that this is the 4th infection since October, we are referring you to the urologist for further evaluation of your frequent bladder infections.  There may be a component related to atrophic vaginitis, but with your history of breast cancer, topical estrogen wouldn't be my first choice--let's see what evaluation they want to do and what recommendations they have.  Stay well hydrated, don't hold your urine for long periods, wipe front to back.   Take the antibiotic twice daily for a week.  Contact us if your symptoms are persisting/worsening--they should improve after 24-48 hours.  Return for re-evaluation if fever, flank pain, vomiting or other problems.  We will contact you with your culture results and change the antibiotics if indicated.    Urinary Tract Infection, Adult A urinary tract infection (UTI) is an infection of any part of the urinary tract, which includes the kidneys, ureters, bladder, and urethra. These organs make, store, and get rid of urine in the body. UTI can be a bladder infection (cystitis) or kidney infection (pyelonephritis). What are the causes? This infection may be caused by fungi, viruses, or bacteria. Bacteria are the most common cause of UTIs. This condition can also be caused by repeated incomplete emptying of the bladder during urination. What increases the risk? This condition is more likely to develop if:  You ignore your need to urinate or hold urine for long periods of time.  You do not empty your bladder completely during urination.  You wipe back to front after urinating or having a bowel movement, if you are female.  You are uncircumcised, if you are female.  You are constipated.  You have a urinary catheter that stays in place (indwelling).  You have a weak defense (immune) system.  You have a medical condition that affects your bowels, kidneys, or bladder.  You have diabetes.  You take antibiotic medicines frequently or for long periods  of time, and the antibiotics no longer work well against certain types of infections (antibiotic resistance).  You take medicines that irritate your urinary tract.  You are exposed to chemicals that irritate your urinary tract.  You are female.  What are the signs or symptoms? Symptoms of this condition include:  Fever.  Frequent urination or passing small amounts of urine frequently.  Needing to urinate urgently.  Pain or burning with urination.  Urine that smells bad or unusual.  Cloudy urine.  Pain in the lower abdomen or back.  Trouble urinating.  Blood in the urine.  Vomiting or being less hungry than normal.  Diarrhea or abdominal pain.  Vaginal discharge, if you are female.  How is this diagnosed? This condition is diagnosed with a medical history and physical exam. You will also need to provide a urine sample to test your urine. Other tests may be done, including:  Blood tests.  Sexually transmitted disease (STD) testing.  If you have had more than one UTI, a cystoscopy or imaging studies may be done to determine the cause of the infections. How is this treated? Treatment for this condition often includes a combination of two or more of the following:  Antibiotic medicine.  Other medicines to treat less common causes of UTI.  Over-the-counter medicines to treat pain.  Drinking enough water to stay hydrated.  Follow these instructions at home:  Take over-the-counter and prescription medicines only as told by your health care provider.  If you were prescribed an antibiotic, take it as told by your health care provider. Do  not stop taking the antibiotic even if you start to feel better.  Avoid alcohol, caffeine, tea, and carbonated beverages. They can irritate your bladder.  Drink enough fluid to keep your urine clear or pale yellow.  Keep all follow-up visits as told by your health care provider. This is important.  Make sure to: ? Empty your  bladder often and completely. Do not hold urine for long periods of time. ? Empty your bladder before and after sex. ? Wipe from front to back after a bowel movement if you are female. Use each tissue one time when you wipe. Contact a health care provider if:  You have back pain.  You have a fever.  You feel nauseous or vomit.  Your symptoms do not get better after 3 days.  Your symptoms go away and then return. Get help right away if:  You have severe back pain or lower abdominal pain.  You are vomiting and cannot keep down any medicines or water. This information is not intended to replace advice given to you by your health care provider. Make sure you discuss any questions you have with your health care provider. Document Released: 01/05/2005 Document Revised: 09/09/2015 Document Reviewed: 02/16/2015 Elsevier Interactive Patient Education  2017 [employer].

## 2016-10-25 NOTE — Progress Notes (Signed)
Chief Complaint  Patient presents with  . Urinary Frequency    and burning with urination as well as some pressure. Started last Thursday, went away Sunday and came back really badly yesterday.     5-6 days ago she started with lower abdominal pressure and pain with urination.  She is also having urinary frequency and urgency. Denies incontinence or hematuria.  Denies fever, chills, nausea, vomiting or flank pain.  Just the lower abdominal pressure.  Last UTI was 08/2016, showing enterococcus on culture. She had normal f/u urine dip 5/24 after treatment. She had been treated with cipro. Prior UTI was 06/2016, with culture showing E.coli Prior to that, was 01/2016, showing enterococcus. [id number] E.coli  PMH, PSH, SH reviewed  Outpatient Encounter Prescriptions as of 10/25/2016  Medication Sig  . acetaminophen (TYLENOL) 325 MG tablet Take 650 mg by mouth every 6 (six) hours as needed. Reported on 08/20/2015  . anastrozole (ARIMIDEX) 1 MG tablet Take 1 tablet (1 mg total) by mouth daily.  Marland Kitchen aspirin 81 MG tablet Take 81 mg by mouth daily.  . cholecalciferol (VITAMIN D) 1000 units tablet Take 1,000 Units by mouth daily.  . Colloidal Oatmeal (GOLD BOND ECZEMA RELIEF) 2 % CREA Apply 1 application topically as needed.  Marland Kitchen denosumab (PROLIA) 60 MG/ML SOLN injection Inject 60 mg into the skin every 6 (six) months. Administer in upper arm, thigh, or abdomen  . latanoprost (XALATAN) 0.005 % ophthalmic solution Place 1 drop into both eyes at bedtime.   . Multiple Vitamins-Minerals (ALIVE WOMENS 50+ PO) Take 1 tablet by mouth daily.  Marland Kitchen tiotropium (SPIRIVA HANDIHALER) 18 MCG inhalation capsule PLACE 1 CAPSULE INTO INHALER AND INHALE THE CONTENTS DAILY  . ciprofloxacin (CIPRO) 500 MG tablet Take 500 mg by mouth 2 (two) times daily.   . fluticasone (FLOVENT HFA) 110 MCG/ACT inhaler Inhale 2 puffs into the lungs 2 (two) times daily. Reported on 10/02/2015 (Patient not taking: Reported on 10/25/2016)  . ibuprofen  (ADVIL,MOTRIN) 200 MG tablet Take 600 mg by mouth every 8 (eight) hours as needed for moderate pain.   No facility-administered encounter medications on file as of 10/25/2016.    Allergies  Allergen Reactions  . Codeine Other (See Comments)  . Penicillins Other (See Comments)  . Sulfa Antibiotics Other (See Comments)    Told as a child by her mother never to take  . Fish Allergy Diarrhea    Scaled fish- when pregnant ate some recently no problem    ROS:  No fever, chills, URI symptoms, nausea, vomiting, bowel changes, vaginal discharge or bleeding. Urinary complaints per HPI. Some headaches, relieved by coffee. Didn't sleep well last night due to abdominal pressure/discomfort. Breathing is at baseline. Denies shortness of breath, chest pain.  PHYSICAL EXAM:  BP 130/76 (BP Location: Left Arm, Patient Position: Sitting, Cuff Size: Normal)   Pulse 92   Temp 98.3 F (36.8 C) (Tympanic)   Ht 5\' 3"  (1.6 m)   Wt 104 lb 3.2 oz (47.3 kg)   BMI 18.46 kg/m   Well appearing, thin elderly female in no distress Back: No CVA tenderness Abdomen: very mild tenderness in suprapubic area. Remainder is nontender, soft, no rebound/guarding, no mass Heart: regular rate and rhythm Lungs: fairly clear Extremities: no edema Skin: normal turgor, no rash Psych: normal mood, affect, hygiene and grooming Neuro: alert and oriented, cranial nerves intact, normal gait  Urine dip:  Trace blood, 2+ leuks, negative nitrite  ASSESSMENT/PLAN:  Acute cystitis without hematuria - Plan: Urine Culture,  nitrofurantoin, macrocrystal-monohydrate, (MACROBID) 100 MG capsule  Urinary frequency - Plan: POCT Urinalysis DIP (Proadvantage Device)  Recurrent UTI - given frequency of infections over the past year, refer to urology for eval - Plan: Ambulatory referral to Urology   Given that this is the 4th infection since October, we are referring you to the urologist for further evaluation of your frequent bladder  infections.  There may be a component related to atrophic vaginitis, but with your history of breast cancer, topical estrogen wouldn't be my first choice--let's see what evaluation they want to do and what recommendations they have.  Stay well hydrated, don't hold your urine for long periods, wipe front to back.   Take the antibiotic twice daily for a week.  Contact us if your symptoms are persisting/worsening--they should improve after 24-48 hours.  Return for re-evaluation if fever, flank pain, vomiting or other problems.  We will contact you with your culture results and change the antibiotics if indicated.

## 2016-10-27 LAB — URINE CULTURE

## 2016-11-07 ENCOUNTER — Other Ambulatory Visit: Payer: Self-pay | Admitting: Hematology and Oncology

## 2016-11-13 ENCOUNTER — Telehealth: Payer: Self-pay

## 2016-11-13 NOTE — Telephone Encounter (Signed)
Spoke with patient and she is aware of her new appt due to cme  Kelsey Fuller

## 2016-11-23 DIAGNOSIS — H401131 Primary open-angle glaucoma, bilateral, mild stage: Secondary | ICD-10-CM | POA: Diagnosis not present

## 2016-12-19 ENCOUNTER — Other Ambulatory Visit: Payer: Self-pay

## 2016-12-19 DIAGNOSIS — Z170 Estrogen receptor positive status [ER+]: Principal | ICD-10-CM

## 2016-12-19 DIAGNOSIS — C50211 Malignant neoplasm of upper-inner quadrant of right female breast: Secondary | ICD-10-CM

## 2016-12-20 ENCOUNTER — Ambulatory Visit (HOSPITAL_BASED_OUTPATIENT_CLINIC_OR_DEPARTMENT_OTHER): Payer: Medicare Other | Admitting: Hematology and Oncology

## 2016-12-20 ENCOUNTER — Ambulatory Visit (HOSPITAL_BASED_OUTPATIENT_CLINIC_OR_DEPARTMENT_OTHER): Payer: Medicare Other

## 2016-12-20 ENCOUNTER — Other Ambulatory Visit (HOSPITAL_BASED_OUTPATIENT_CLINIC_OR_DEPARTMENT_OTHER): Payer: Medicare Other

## 2016-12-20 DIAGNOSIS — Z170 Estrogen receptor positive status [ER+]: Secondary | ICD-10-CM

## 2016-12-20 DIAGNOSIS — M818 Other osteoporosis without current pathological fracture: Secondary | ICD-10-CM

## 2016-12-20 DIAGNOSIS — M8580 Other specified disorders of bone density and structure, unspecified site: Secondary | ICD-10-CM

## 2016-12-20 DIAGNOSIS — Z79811 Long term (current) use of aromatase inhibitors: Secondary | ICD-10-CM | POA: Diagnosis not present

## 2016-12-20 DIAGNOSIS — C50211 Malignant neoplasm of upper-inner quadrant of right female breast: Secondary | ICD-10-CM

## 2016-12-20 LAB — COMPREHENSIVE METABOLIC PANEL
ALT: 12 U/L (ref 0–55)
AST: 20 U/L (ref 5–34)
Albumin: 3.8 g/dL (ref 3.5–5.0)
Alkaline Phosphatase: 70 U/L (ref 40–150)
Anion Gap: 8 mEq/L (ref 3–11)
BILIRUBIN TOTAL: 0.66 mg/dL (ref 0.20–1.20)
BUN: 17.5 mg/dL (ref 7.0–26.0)
CALCIUM: 9.1 mg/dL (ref 8.4–10.4)
CO2: 25 meq/L (ref 22–29)
CREATININE: 0.8 mg/dL (ref 0.6–1.1)
Chloride: 107 mEq/L (ref 98–109)
EGFR: 69 mL/min/{1.73_m2} — ABNORMAL LOW (ref >90)
Glucose: 88 mg/dl (ref 70–140)
Potassium: 4.2 mEq/L (ref 3.5–5.1)
Sodium: 140 mEq/L (ref 136–145)
TOTAL PROTEIN: 7.2 g/dL (ref 6.4–8.3)

## 2016-12-20 LAB — CBC WITH DIFFERENTIAL/PLATELET
BASO%: 0.4 % (ref 0.0–2.0)
Basophils Absolute: 0 10*3/uL (ref 0.0–0.1)
EOS ABS: 0.4 10*3/uL (ref 0.0–0.5)
EOS%: 6.2 % (ref 0.0–7.0)
HCT: 43.5 % (ref 34.8–46.6)
HEMOGLOBIN: 14.2 g/dL (ref 11.6–15.9)
LYMPH#: 2 10*3/uL (ref 0.9–3.3)
LYMPH%: 35.1 % (ref 14.0–49.7)
MCH: 29.3 pg (ref 25.1–34.0)
MCHC: 32.6 g/dL (ref 31.5–36.0)
MCV: 89.9 fL (ref 79.5–101.0)
MONO#: 0.7 10*3/uL (ref 0.1–0.9)
MONO%: 12 % (ref 0.0–14.0)
NEUT%: 46.3 % (ref 38.4–76.8)
NEUTROS ABS: 2.6 10*3/uL (ref 1.5–6.5)
PLATELETS: 186 10*3/uL (ref 145–400)
RBC: 4.84 10*6/uL (ref 3.70–5.45)
RDW: 13.7 % (ref 11.2–14.5)
WBC: 5.7 10*3/uL (ref 3.9–10.3)

## 2016-12-20 LAB — DRAW EXTRA CLOT TUBE

## 2016-12-20 MED ORDER — DENOSUMAB 60 MG/ML ~~LOC~~ SOLN
60.0000 mg | SUBCUTANEOUS | Status: DC
  Administered 2016-12-20: 60 mg via SUBCUTANEOUS
  Filled 2016-12-20: qty 1

## 2016-12-20 NOTE — Assessment & Plan Note (Signed)
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 No indication for chemotherapy because of final tumor size was less than 0.5 cm 2. Adjuvant radiation therapy 10/01/2015 to 10/29/2015 3. Adjuvant antiestrogen therapy with anastrozole 1 mg daily 5 years started 12/16/2015  Anastrozole toxicities: Denies any hot flashes or myalgias.  Osteoporosis: On Prolia with calcium and vitamin D. She'll receive a dose of Prolia today.  Return to clinic in 1 year for follow-up and in 6 months for prolia

## 2016-12-20 NOTE — Patient Instructions (Signed)
Denosumab injection  What is this medicine?  DENOSUMAB (den oh sue mab) slows bone breakdown. Prolia is used to treat osteoporosis in women after menopause and in men. Xgeva is used to prevent bone fractures and other bone problems caused by cancer bone metastases. Xgeva is also used to treat giant cell tumor of the bone.  This medicine may be used for other purposes; ask your health care provider or pharmacist if you have questions.  What should I tell my health care provider before I take this medicine?  They need to know if you have any of these conditions:  -dental disease  -eczema  -infection or history of infections  -kidney disease or on dialysis  -low blood calcium or vitamin D  -malabsorption syndrome  -scheduled to have surgery or tooth extraction  -taking medicine that contains denosumab  -thyroid or parathyroid disease  -an unusual reaction to denosumab, other medicines, foods, dyes, or preservatives  -pregnant or trying to get pregnant  -breast-feeding  How should I use this medicine?  This medicine is for injection under the skin. It is given by a health care professional in a hospital or clinic setting.  If you are getting Prolia, a special MedGuide will be given to you by the pharmacist with each prescription and refill. Be sure to read this information carefully each time.  For Prolia, talk to your pediatrician regarding the use of this medicine in children. Special care may be needed. For Xgeva, talk to your pediatrician regarding the use of this medicine in children. While this drug may be prescribed for children as young as 13 years for selected conditions, precautions do apply.  Overdosage: If you think you have taken too much of this medicine contact a poison control center or emergency room at once.  NOTE: This medicine is only for you. Do not share this medicine with others.  What if I miss a dose?  It is important not to miss your dose. Call your doctor or health care professional if you are  unable to keep an appointment.  What may interact with this medicine?  Do not take this medicine with any of the following medications:  -other medicines containing denosumab  This medicine may also interact with the following medications:  -medicines that suppress the immune system  -medicines that treat cancer  -steroid medicines like prednisone or cortisone  This list may not describe all possible interactions. Give your health care provider a list of all the medicines, herbs, non-prescription drugs, or dietary supplements you use. Also tell them if you smoke, drink alcohol, or use illegal drugs. Some items may interact with your medicine.  What should I watch for while using this medicine?  Visit your doctor or health care professional for regular checks on your progress. Your doctor or health care professional may order blood tests and other tests to see how you are doing.  Call your doctor or health care professional if you get a cold or other infection while receiving this medicine. Do not treat yourself. This medicine may decrease your body's ability to fight infection.  You should make sure you get enough calcium and vitamin D while you are taking this medicine, unless your doctor tells you not to. Discuss the foods you eat and the vitamins you take with your health care professional.  See your dentist regularly. Brush and floss your teeth as directed. Before you have any dental work done, tell your dentist you are receiving this medicine.  Do   not become pregnant while taking this medicine or for 5 months after stopping it. Women should inform their doctor if they wish to become pregnant or think they might be pregnant. There is a potential for serious side effects to an unborn child. Talk to your health care professional or pharmacist for more information.  What side effects may I notice from receiving this medicine?  Side effects that you should report to your doctor or health care professional as soon as  possible:  -allergic reactions like skin rash, itching or hives, swelling of the face, lips, or tongue  -breathing problems  -chest pain  -fast, irregular heartbeat  -feeling faint or lightheaded, falls  -fever, chills, or any other sign of infection  -muscle spasms, tightening, or twitches  -numbness or tingling  -skin blisters or bumps, or is dry, peels, or red  -slow healing or unexplained pain in the mouth or jaw  -unusual bleeding or bruising  Side effects that usually do not require medical attention (Report these to your doctor or health care professional if they continue or are bothersome.):  -muscle pain  -stomach upset, gas  This list may not describe all possible side effects. Call your doctor for medical advice about side effects. You may report side effects to FDA at 1-800-FDA-1088.  Where should I keep my medicine?  This medicine is only given in a clinic, doctor's office, or other health care setting and will not be stored at home.  NOTE: This sheet is a summary. It may not cover all possible information. If you have questions about this medicine, talk to your doctor, pharmacist, or health care provider.      2016, Elsevier/Gold Standard. (2011-09-26 12:37:47)

## 2016-12-20 NOTE — Progress Notes (Signed)
Patient Care Team: Rita Ohara, MD as PCP - General (Family Medicine)  DIAGNOSIS:  Encounter Diagnosis  Name Primary?  . Malignant neoplasm of upper-inner quadrant of right breast in female, estrogen receptor positive ([place])     SUMMARY OF ONCOLOGIC HISTORY:   Breast cancer of upper-inner quadrant of right female breast ([place])   06/30/2015 Imaging    DEXA scan: Osteoporosis (T-score -3.1)      08/06/2015 Mammogram    Right breast mammogram and ultrasound revealed asymmetry posteriorly 4 x 4 x 3 mm, axilla negative,Tis N0 stage 0      08/06/2015 Initial Diagnosis    Right breast biopsy 2:00 position goal DCIS with necrosis intermediate grade,ER 100%, PR 85%,?foci of early stromal invasion      09/01/2015 Surgery    Right lumpectomy [facility]): IDC grade 2, 0.4 cm, intermediate grade DCIS, lymph/vascular invasion identified, margins negative, ER 100%, PR 40%, Ki-67 10%, HER-2 negative (ratio 1.25).  pT1a,pNx      10/01/2015 - 10/30/2015 Radiation Therapy    Adjuvant radiation therapy Lisbeth Renshaw). The Right breast was treated to 42.5 Gy in 17 fractions at 2.5 Gy per fraction. Right breast was boosted to 7.5 Gy in 3 fractions at 2.5 Gy per fraction.       12/16/2015 -  Anti-estrogen oral therapy    Anastrozole 1 mg daily. Planned duration of therapy: 5 years.        CHIEF COMPLIANT: Follow-up on anastrozole therapy  INTERVAL HISTORY: Kelsey Fuller is a 76 year old with above-mentioned history of right breast cancer who underwent lumpectomy radiation and is currently on anastrozole. She is tolerating it extremely well. She denies any hot flashes or myalgias. Her skin is extremely dry. She has intermittent pain in the right breast but it hadn't been bothering her.  REVIEW OF SYSTEMS:   Constitutional: Denies fevers, chills or abnormal weight loss Eyes: Denies blurriness of vision Ears, nose, mouth, throat, and face: Denies mucositis or sore throat Respiratory: Denies cough, dyspnea or  wheezes Cardiovascular: Denies palpitation, chest discomfort Gastrointestinal:  Denies nausea, heartburn or change in bowel habits Skin: Denies abnormal skin rashes Lymphatics: Denies new lymphadenopathy or easy bruising Neurological:Denies numbness, tingling or new weaknesses Behavioral/Psych: Mood is stable, no new changes  Extremities: No lower extremity edema Breast:  Intermittent breast pain All other systems were reviewed with the patient and are negative.  I have reviewed the past medical history, past surgical history, social history and family history with the patient and they are unchanged from previous note.  ALLERGIES:  is allergic to codeine; penicillins; sulfa antibiotics; and fish allergy.  MEDICATIONS:  Current Outpatient Prescriptions  Medication Sig Dispense Refill  . acetaminophen (TYLENOL) 325 MG tablet Take 650 mg by mouth every 6 (six) hours as needed. Reported on 08/20/2015    . anastrozole (ARIMIDEX) 1 MG tablet TAKE 1 TABLET DAILY 90 tablet 3  . aspirin 81 MG tablet Take 81 mg by mouth daily.    . cholecalciferol (VITAMIN D) 1000 units tablet Take 1,000 Units by mouth daily.    . Colloidal Oatmeal (GOLD BOND ECZEMA RELIEF) 2 % CREA Apply 1 application topically as needed.    Marland Kitchen denosumab (PROLIA) 60 MG/ML SOLN injection Inject 60 mg into the skin every 6 (six) months. Administer in upper arm, thigh, or abdomen    . fluticasone (FLOVENT HFA) 110 MCG/ACT inhaler Inhale 2 puffs into the lungs 2 (two) times daily. Reported on 10/02/2015 (Patient not taking: Reported on 10/25/2016) 3 Inhaler 1  .  ibuprofen (ADVIL,MOTRIN) 200 MG tablet Take 600 mg by mouth every 8 (eight) hours as needed for moderate pain.    Marland Kitchen latanoprost (XALATAN) 0.005 % ophthalmic solution Place 1 drop into both eyes at bedtime.   3  . Multiple Vitamins-Minerals (ALIVE WOMENS 50+ PO) Take 1 tablet by mouth daily.    . nitrofurantoin, macrocrystal-monohydrate, (MACROBID) 100 MG capsule Take 1 capsule  (100 mg total) by mouth 2 (two) times daily. 14 capsule 0  . tiotropium (SPIRIVA HANDIHALER) 18 MCG inhalation capsule PLACE 1 CAPSULE INTO INHALER AND INHALE THE CONTENTS DAILY 90 capsule 1   No current facility-administered medications for this visit.     PHYSICAL EXAMINATION: ECOG PERFORMANCE STATUS: 1 - Symptomatic but completely ambulatory  Vitals:   12/20/16 1043  BP: (!) 166/65  Pulse: 89  Resp: 17  Temp: 98.2 F (36.8 C)  SpO2: 98%   Filed Weights   12/20/16 1043  Weight: 104 lb 11.2 oz (47.5 kg)    GENERAL:alert, no distress and comfortable SKIN: skin color, texture, turgor are normal, no rashes or significant lesions EYES: normal, Conjunctiva are pink and non-injected, sclera clear OROPHARYNX:no exudate, no erythema and lips, buccal mucosa, and tongue normal  NECK: supple, thyroid normal size, non-tender, without nodularity LYMPH:  no palpable lymphadenopathy in the cervical, axillary or inguinal LUNGS: clear to auscultation and percussion with normal breathing effort HEART: regular rate & rhythm and no murmurs and no lower extremity edema ABDOMEN:abdomen soft, non-tender and normal bowel sounds MUSCULOSKELETAL:no cyanosis of digits and no clubbing  NEURO: alert & oriented x 3 with fluent speech, no focal motor/sensory deficits EXTREMITIES: No lower extremity edema  LABORATORY DATA:  I have reviewed the data as listed   Chemistry      Component Value Date/Time   NA 140 12/20/2016 1010   K 4.2 12/20/2016 1010   CL 105 06/09/2016 1148   CO2 25 12/20/2016 1010   BUN 17.5 12/20/2016 1010   CREATININE 0.8 12/20/2016 1010      Component Value Date/Time   CALCIUM 9.1 12/20/2016 1010   ALKPHOS 70 12/20/2016 1010   AST 20 12/20/2016 1010   ALT 12 12/20/2016 1010   BILITOT 0.66 12/20/2016 1010       Lab Results  Component Value Date   WBC 5.7 12/20/2016   HGB 14.2 12/20/2016   HCT 43.5 12/20/2016   MCV 89.9 12/20/2016   PLT 186 12/20/2016   NEUTROABS  2.6 12/20/2016    ASSESSMENT & PLAN:  Breast cancer of upper-inner quadrant of right female breast ([facility]) Right lumpectomy 09/01/2015: IDC grade 2, 0.4 cm, IgG DCIS, lymph/vascular invasion identified, margins negative, ER positive, PR 40%, Ki-67 10%, HER-2 negative duration 1.25  Treatment summary: 1. No indication for chemotherapy because of final tumor size was less than 0.5 cm 2. Adjuvant radiation therapy 10/01/2015 to 10/29/2015 3. Adjuvant antiestrogen therapy with anastrozole 1 mg daily 5 years started 12/16/2015  Anastrozole toxicities: Denies any hot flashes or myalgias.  Osteoporosis: On Prolia with calcium and vitamin D. She'll receive a dose of Prolia today.  Return to clinic in 1 year for follow-up and in 6 months for prolia   I spent 25 minutes talking to the patient of which more than half was spent in counseling and coordination of care.  No orders of the defined types were placed in this encounter.  The patient has a good understanding of the overall plan. she agrees with it. she will call with any problems that may  develop before the next visit here.   Rulon Eisenmenger, MD 12/20/16

## 2016-12-21 ENCOUNTER — Ambulatory Visit: Payer: Medicare Other | Admitting: Hematology and Oncology

## 2016-12-21 ENCOUNTER — Ambulatory Visit: Payer: Medicare Other

## 2016-12-21 ENCOUNTER — Other Ambulatory Visit: Payer: Medicare Other

## 2017-01-02 ENCOUNTER — Telehealth: Payer: Self-pay | Admitting: Family Medicine

## 2017-01-02 NOTE — Telephone Encounter (Signed)
Pt left message requesting last office note faxed to Dr. Jeffie Pollock Urologist, this was faxed to t# [phone]

## 2017-01-03 ENCOUNTER — Telehealth: Payer: Self-pay | Admitting: Family Medicine

## 2017-01-03 NOTE — Telephone Encounter (Signed)
Pt called and stated that she is having symptom of another UTI. She has low stomach pressure and back pain. Her urine is cloudy and has a odor. She does have a appt with Urology tomorrow. She wants to know if she should wait until tomorrow or come see [place]. I placed a call to Dr. Tomi Bamberger to inquire as we have limited availability today. Dr. Tomi Bamberger called and stated pt should call urologist and explain the symptoms to them. Possibly see if they can see her earlier or get advise. Pt was called and advised. Pt. states she is not experiencing fever, throwing up, blood in urine or kidney pain. Dr. Tomi Bamberger states that pt can take OTC AZO to help with symptoms. Pt again advised and verified understanding.

## 2017-01-04 DIAGNOSIS — N200 Calculus of kidney: Secondary | ICD-10-CM | POA: Diagnosis not present

## 2017-01-04 DIAGNOSIS — N3020 Other chronic cystitis without hematuria: Secondary | ICD-10-CM | POA: Diagnosis not present

## 2017-01-04 DIAGNOSIS — N952 Postmenopausal atrophic vaginitis: Secondary | ICD-10-CM | POA: Diagnosis not present

## 2017-01-04 DIAGNOSIS — R3914 Feeling of incomplete bladder emptying: Secondary | ICD-10-CM | POA: Diagnosis not present

## 2017-01-08 NOTE — Progress Notes (Signed)
Chief Complaint  Patient presents with  . Medicare Wellness    fasting AWV/med check + with pelvic exam. Urologist put her on estrogen cream and she is a littel bit leery about taking it with her cancer medication because it is an estrogen blocker. Has been having a pain that starts in her mid back once in a while that radiates around to her front and sometimes up to her ears and is extremely painful.     Kelsey Fuller is a 76 y.o. female who presents for annual wellness visit and follow-up on chronic medical conditions.  She has the following concerns:  Frequent UTI's--she was referred to the urologist, and saw Dr. Jeffie Pollock 9/26.  She started having symptoms of recurrent infection the day prior to her visit. She was started on nitrofurantoin for the acute infection, and got a second prescription (same med) for prevention.  She goes back the end of the month for f/u and further evaluation. He prescribed Premarin vaginal cream--hasn't started yet, concerned about this with her h/o breast cancer.  She reports she is to use this intravaginally, not just topically externally.   Previously complained of cramps in feet, legs, hands, throughout the day--she no longer has the cramps during the day, just some at night. She previously used the white bar of soap, but not recently.  Osteoporosis: She has been getting Prolia through the cancer center, last injection on 12/20/16.  She had her first Prolia injection in 12/2015. She is tolerating this without side effects. She h/o mildly low vitamin D in 10/2014; still low at 28 in 82017; last level was normal at 32 in 06/2016. She is currently taking MVI and Vit D 1000 IU daily.  COPD: She states her breathing is good. She feels like the medications are helping. She is on Spiriva and Flovent, using 2 puff BID. She never really complained of many symptoms--just shortness of breath when taking her trash cans up the drively.  She no longer does this, her son does. But  she denies shortness of breath walking up the driveway, not carrying cans).  Spirometry at her visit 11/2015, when taking 220 mcg Flovent once daily (rather than 1 puff BID, as instructed) showed moderately severe obstruction. Only minimally improved, if at all, from last check, prior to being on inhaled steroids. Flovent dose was supposed to be increased to 220 BID, along with staying on Spiriva. She did not do this--still just taking 1 puff twice daily of the Flovent (along with spiriva). She hasn't needed any albuterol in over a year.  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 She is on anastrozole 1 mg daily, planned for 5 years. Denies hot flashes or side effects.  She did not have a mammogram yet this year.  She sometimes gets a tightness in her chest, as she described last year as well. Alka Selzer Plus extra strength is the only thing that helps.  It goes around the entire chest, bilaterally, into the back, and up into the back of her neck.  Burping also helps some. This occurs sporadically, once every other month, last time was last week.  Previously saw Dr. Acie Fredrickson, not since 2015. Myoview study revealed normal left ventricular function with no evidence of ischemia. Her echocardiogram revealed mild diastolic dysfunction he just trivial valvular disease. Her dyspnea was felt to be due to her COPD.  Not sure if she was having any  chest pain at that time.   Immunization History  Administered Date(s) Administered  . Influenza, High Dose Seasonal PF 01/26/2015, 12/09/2015  . Pneumococcal Conjugate-13 12/09/2015  . Tdap 06/04/2015   She recalls getting one pneumonia vaccine through a pharmacy, doesn't recall which type of vaccine. She believes this was likely over 5-6 years ago. She knows that she has had a shingles vaccine in the past (zostavax) Last Pap smear: 10/2014, normal, with no high risk HPV Last  mammogram: 07/2015 Last colonoscopy: 07/2006 Last DEXA: 06/2015--osteoporosis Dentist: has upper and lower dentures; hasn't been to the dentist in many years. She plans to go. Ophtho: every 3 months (for glaucoma) Exercise: yardwork, otherwise no regular exercise. Walked once last month.  Lipid screen: Lab Results  Component Value Date   CHOL 193 12/09/2015   HDL 77 12/09/2015   LDLCALC 102 12/09/2015   TRIG 68 12/09/2015   CHOLHDL 2.5 12/09/2015    Other doctors caring for patient include: Ophtho: Dr. Delman Cheadle GI: Dr. Collene Mares Cardiologist: Dr. Acie Fredrickson  Dermatologist (hasn't seen in many years): Dr. Denna Haggard. Oncologist: Dr. Lindi Adie Radiation oncology: Dr. Lisbeth Renshaw Surgeon: Dr. Barry Dienes  Depression screen negative Fall screen negative Functional Status screens notable only for slight decrease in hearing--needing the TV a little louder See full screens in epic.   End of Life Discussion: Patient does not havea living will and medical power of attorney. She was given paperwork last year, discussed it with her sons, filled it out but hasn't gotten it notarized yet.  Past Medical History:  Diagnosis Date  . Allergy   . Arthritis   . Asthma    (pt denies)  . Breast cancer ([place])    ductal CA in situ with necrosis on bx 07/2015 (R breast)  . Breast cancer of upper-inner quadrant of right female breast ([facility]) 08/07/2015  . Chronic headaches    (resolved)  . Complication of anesthesia   . COPD (chronic obstructive pulmonary disease) ([place])   . Glaucoma   . Leg pain   . Osteoporosis   . PONV (postoperative nausea and vomiting)   . Shortness of breath on exertion   . Thrombophlebitis     Past Surgical History:  Procedure Laterality Date  . APPENDECTOMY    . BREAST LUMPECTOMY WITH RADIOACTIVE SEED LOCALIZATION Right 09/01/2015   Procedure: RADIOACTIVE SEED GUIDED RIGHT BREAST LUMPECTOMY;  Surgeon: Stark Klein, MD;  Location: [facility];  Service: General;  Laterality: Right;  . BREAST  SURGERY    . TONSILLECTOMY    . VARICOSE VEIN SURGERY      Social History   Social History  . Marital status: Married    Spouse name: N/A  . Number of children: N/A  . Years of education: N/A   Occupational History  . Not on file.   Social History Main Topics  . Smoking status: Former Smoker    Packs/day: 0.50    Years: 20.00    Types: Cigarettes    Quit date: 04/12/1975  . Smokeless tobacco: Never Used     Comment: passive tobacco exposure as well, from husband  . Alcohol use 0.6 oz/week    1 Standard drinks or equivalent per week     Comment: 1 glass of wine (very small glass) 3x/week  . Drug use: No  . Sexual activity: Not Currently   Other Topics Concern  . Not on file   Social History Narrative   Lives alone.  Widowed in 07/2014.  3 sons, 3 grandchildren, all locally.  Originally from [place], [place] ([place]). Retired Psychiatric nurse. Volunteers at [employer] History  Problem Relation Age of Onset  . Diabetes Mother   . Heart disease Mother        congestive heart failure  . Hypertension Mother   . Dementia Mother   . Heart disease Father        rheumatic heart disease  . Deep vein thrombosis Son   . Deep vein thrombosis Maternal Grandfather   . Pulmonary embolism Son        after a boating accident, long drive; had DVT/PE  . Deep vein thrombosis Cousin   . Cancer Neg Hx     Outpatient Encounter Prescriptions as of 01/09/2017  Medication Sig Note  . anastrozole (ARIMIDEX) 1 MG tablet TAKE 1 TABLET DAILY   . Colloidal Oatmeal (GOLD BOND ECZEMA RELIEF) 2 % CREA Apply 1 application topically as needed.   Marland Kitchen denosumab (PROLIA) 60 MG/ML SOLN injection Inject 60 mg into the skin every 6 (six) months. Administer in upper arm, thigh, or abdomen   . fluticasone (FLOVENT HFA) 110 MCG/ACT inhaler Inhale 2 puffs into the lungs 2 (two) times daily. Reported on 10/02/2015 01/09/2017: Using 1 puff twice daily  . latanoprost (XALATAN) 0.005 % ophthalmic solution Place 1 drop  into both eyes at bedtime.    . Multiple Vitamins-Minerals (ALIVE WOMENS 50+ PO) Take 1 tablet by mouth daily.   . nitrofurantoin, macrocrystal-monohydrate, (MACROBID) 100 MG capsule Take 1 capsule (100 mg total) by mouth 2 (two) times daily.   Marland Kitchen tiotropium (SPIRIVA HANDIHALER) 18 MCG inhalation capsule PLACE 1 CAPSULE INTO INHALER AND INHALE THE CONTENTS DAILY   . acetaminophen (TYLENOL) 325 MG tablet Take 650 mg by mouth every 6 (six) hours as needed. Reported on 08/20/2015   . aspirin 81 MG tablet Take 81 mg by mouth daily. 01/09/2017: She plans to restart (h/o thrombophlebitis)  . cholecalciferol (VITAMIN D) 1000 units tablet Take 1,000 Units by mouth daily. 01/09/2017: Recently ran out  . ibuprofen (ADVIL,MOTRIN) 200 MG tablet Take 600 mg by mouth every 8 (eight) hours as needed for moderate pain.   Marland Kitchen PREMARIN vaginal cream Place 1 Applicatorful vaginally daily.  01/09/2017: rx'd by urologist, hasn't taken it yet   Facility-Administered Encounter Medications as of 01/09/2017  Medication  . denosumab (PROLIA) injection 60 mg    Allergies  Allergen Reactions  . Codeine Other (See Comments)  . Penicillins Other (See Comments)  . Sulfa Antibiotics Other (See Comments)    Told as a child by her mother never to take  . Fish Allergy Diarrhea    Scaled fish- when pregnant ate some recently no problem    ROS: Denies fever, chills, URI symptoms, cough. She deniesvision changes, ear pain, sore throat, breast concerns, palpitations, dizziness, syncope,cough, swelling, nausea, vomiting, diarrhea, constipation, abdominal pain, melena, hematochezia, indigestion/heartburn, hematuria, incontinence, vaginal bleeding, discharge, odor or itch, genital lesions, joint pains (intermittent low back pain and sciatica); has some hand arthriitis/stiffness, stable; no numbness, tingling, weakness, tremor, suspicious skin lesions, depression, anxiety, abnormal bleeding/bruising, or enlarged lymph nodes. Rare  headaches (no longer gets migraines). Recent UTI symptoms, h/o recurrent UTI's. Symptoms resolved. SK's--many, some have been removed and recurred. No significant/recent changes or inflammation Weight-has been stable, good appetite Chest pain and shortness of breath/COPD per HPI Skin is dry, itching around her neck and arms. Gold Bond has been helping. Some trouble recalling people's names, just slight.  No other memory concerns. Slight decrease in hearing.  PHYSICAL EXAM:  BP 120/70 (BP Location: Left Arm, Patient Position: Sitting, Cuff Size: Normal)   Pulse 80   Ht 5' 3.5" (1.613 m)   Wt 106 lb 3.2 oz (48.2 kg)   BMI 18.52 kg/m   Wt Readings from Last 3 Encounters:  01/09/17 106 lb 3.2 oz (48.2 kg)  12/20/16 104 lb 11.2 oz (47.5 kg)  10/25/16 104 lb 3.2 oz (47.3 kg)    General Appearance:  Alert, cooperative, no distress, appears stated age  Head:  Normocephalic, without obvious abnormality, atraumatic  Eyes:  PERRL, conjunctiva/corneas clear, EOM's intact, fundi benign  Ears:  Normal TM's and external ear canals  Nose: Nares normal, mucosa is mildly edematous with clear mucus; no sinus tenderness  Throat: Lips, mucosa, and tongue normal; upper and lower dentures.  Neck: Supple, no lymphadenopathy; thyroid: noenlargement/tenderness/ nodules; no carotid bruit or JVD  Back:  Spine nontender, no curvature, ROM normal, no CVAtenderness.  Lungs:  Clear to auscultation bilaterally without wheezes, rales or ronchi; respirations unlabored. Fair air movement. Slightly coarse breath sounds at the left base.  Chest Wall:  No tenderness or deformity  Heart:  Regular rate and rhythm, S1 and S2 normal, no murmur, rub or gallop.   Breast Exam:  No masses, nipple discharge or inversion. No axillary lymphadenopathy. Fibroglandular changes bilaterally, R>L.  WHSS right medial breast, and tattoo's noted from radiation. Tender over right medial breast, over scar  (chronic, per pt)  Abdomen:  Soft, non-tender, nondistended, normoactive bowel sounds, no masses, no hepatosplenomegaly  Genitalia:  Normal external genitalia without lesions. Atrophic changes noted. BUS and vagina normal; no cervical motion tenderness. No abnormal vaginal discharge. Uterus and adnexa not enlarged, nontender, no masses. Pap not performed. She did have some mild tenderness on bimanual exam, anteriorly, over bladder.  Rectal:  Normal tone, no masses or tenderness; guaiac negative stool  Extremities: No clubbing, cyanosis or edema. Onychomycosis noted, along with dry, slightly calloused feet  Pulses: 2+ and symmetric all extremities  Skin: Skin color, turgor normal, no rashes or lesions. Skin is very dry diffusely, especially on her back and legs.  Many large SK's. No suspicious lesions  Lymph nodes: Cervical, supraclavicular, and axillary nodes normal  Neurologic: CNII-XII intact, normal strength, sensation and gait; reflexes 2+ and symmetric throughout. Negative straight leg raise  Psych: Normal mood, affect, hygiene and grooming   Spirometry: Moderately severe obstruction, with low vital capacity.    Chemistry      Component Value Date/Time   NA 140 12/20/2016 1010   K 4.2 12/20/2016 1010   CL 105 06/09/2016 1148   CO2 25 12/20/2016 1010   BUN 17.5 12/20/2016 1010   CREATININE 0.8 12/20/2016 1010      Component Value Date/Time   CALCIUM 9.1 12/20/2016 1010   ALKPHOS 70 12/20/2016 1010   AST 20 12/20/2016 1010   ALT 12 12/20/2016 1010   BILITOT 0.66 12/20/2016 1010     Glucose 88  Lab Results  Component Value Date   WBC 5.7 12/20/2016   HGB 14.2 12/20/2016   HCT 43.5 12/20/2016   MCV 89.9 12/20/2016   PLT 186 12/20/2016     ASSESSMENT/PLAN:  Medicare annual wellness visit, subsequent  Malignant neoplasm of upper-inner quadrant of right breast in female, estrogen receptor positive ([place]) - past due for mammogram,  encouraged to schedule  Vitamin D deficiency - continue supplements  Osteoporosis without current pathological fracture, unspecified osteoporosis type - continue calcium, D and Prolia injections. Discussed weight-bearing exercise  Chronic obstructive pulmonary disease, unspecified COPD type ([place] [facility]) - didn't get much benefit (per spirometry) with Flovent.  Change to Anoro once she finishes her current supply of Spiriva - Plan: Spirometry with Graph, umeclidinium-vilanterol (ANORO ELLIPTA) 62.5-25 MCG/INH AEPB  Aortic atherosclerosis ([facility])  Need for influenza vaccination - Plan: Flu vaccine HIGH DOSE PF (Fluzone High dose)  Need for pneumococcal vaccination - Plan: Pneumococcal polysaccharide vaccine 23-valent greater than or equal to 2yo subcutaneous/IM  Risks/side effects of vaccines reviewed, including Shingrix.  Change to Anoro--given sample (2 weeks) and coupon for free month.  Declines pap--defers to next year  Past due for f/u mammogram Due for colonoscopy with Dr. Pierre Bali to check Cologard instead--referral done  Review of possible labs needed--check Vit D, TSH if weight changes, noncompliant with vitamins (last TSH 2016).   No symptoms (just dry skin).  No add'l labs checked not needed  Discussed silver sneakers vs home exercises Briefly mentioned pulmonary rehab--not interested at this time  Discussed monthly self breast exams and yearly mammograms (past due, dx/treated for cancer last year); at least 30 minutes of aerobic activity at least 5 days/week and weight-bearing exercise 2x/week; proper sunscreen use reviewed; healthy diet, including goals of calcium and vitamin D intake and alcohol recommendations (less than or equal to 1 drink/day) reviewed; regular seatbelt use; changing batteries in smoke detectors. Immunization recommendations discussed--high dose flu shot today; pneumovax booster (due to her COPD).  Colonoscopy recommendations reviewed--last was 07/2006.  Due  now, vs Cologard--prefers Cologard. (Pathology was benign, no adenomatous polyp)   Discussed her wishes--Full Code, Full care. Encouraged her to get the forms notarized and give [place] copies.  Pt to f/u in 6 mos on her COPD (since changing to Anoro sometime in the  Next 2-3 months)   Medicare Attestation I have personally reviewed: The patient's medical and social history Their use of alcohol, tobacco or illicit drugs Their current medications and supplements The patient's functional ability including ADLs,fall risks, home safety risks, cognitive, and hearing and visual impairment Diet and physical activities Evidence for depression or mood disorders  The patient's weight, height, and BMI have been recorded in the chart.  I have made referrals, counseling, and provided education to the patient based on review of the above and I have provided the patient with a written personalized care plan for preventive services.

## 2017-01-09 ENCOUNTER — Encounter: Payer: Self-pay | Admitting: Family Medicine

## 2017-01-09 ENCOUNTER — Ambulatory Visit (INDEPENDENT_AMBULATORY_CARE_PROVIDER_SITE_OTHER): Payer: Medicare Other | Admitting: Family Medicine

## 2017-01-09 VITALS — BP 120/70 | HR 80 | Ht 63.5 in | Wt 106.2 lb

## 2017-01-09 DIAGNOSIS — Z23 Encounter for immunization: Secondary | ICD-10-CM | POA: Diagnosis not present

## 2017-01-09 DIAGNOSIS — Z170 Estrogen receptor positive status [ER+]: Secondary | ICD-10-CM | POA: Diagnosis not present

## 2017-01-09 DIAGNOSIS — I700 Atherosclerosis of aorta: Secondary | ICD-10-CM

## 2017-01-09 DIAGNOSIS — J449 Chronic obstructive pulmonary disease, unspecified: Secondary | ICD-10-CM | POA: Diagnosis not present

## 2017-01-09 DIAGNOSIS — E559 Vitamin D deficiency, unspecified: Secondary | ICD-10-CM

## 2017-01-09 DIAGNOSIS — C50211 Malignant neoplasm of upper-inner quadrant of right female breast: Secondary | ICD-10-CM | POA: Diagnosis not present

## 2017-01-09 DIAGNOSIS — M810 Age-related osteoporosis without current pathological fracture: Secondary | ICD-10-CM | POA: Diagnosis not present

## 2017-01-09 DIAGNOSIS — Z0000 Encounter for general adult medical examination without abnormal findings: Secondary | ICD-10-CM | POA: Diagnosis not present

## 2017-01-09 MED ORDER — UMECLIDINIUM-VILANTEROL 62.5-25 MCG/INH IN AEPB: 1.0000 | INHALATION_SPRAY | Freq: Every day | RESPIRATORY_TRACT | 3 refills | Status: AC

## 2017-01-09 NOTE — Patient Instructions (Addendum)
HEALTH MAINTENANCE RECOMMENDATIONS:  It is recommended that you get at least 30 minutes of aerobic exercise at least 5 days/week (for weight loss, you may need as much as 60-90 minutes). This can be any activity that gets your heart rate up. This can be divided in 10-15 minute intervals if needed, but try and build up your endurance at least once a week.  Weight bearing exercise is also recommended twice weekly.  Eat a healthy diet with lots of vegetables, fruits and fiber.  "Colorful" foods have a lot of vitamins (ie green vegetables, tomatoes, red peppers, etc).  Limit sweet tea, regular sodas and alcoholic beverages, all of which has a lot of calories and sugar.  Up to 1 alcoholic drink daily may be beneficial for women (unless trying to lose weight, watch sugars).  Drink a lot of water.  Calcium recommendations are 1200-1500 mg daily (1500 mg for postmenopausal women or women without ovaries), and vitamin D 1000 IU daily.  This should be obtained from diet and/or supplements (vitamins), and calcium should not be taken all at once, but in divided doses.  Monthly self breast exams and yearly mammograms for women over the age of 19 is recommended.  Sunscreen of at least SPF 30 should be used on all sun-exposed parts of the skin when outside between the hours of 10 am and 4 pm (not just when at beach or pool, but even with exercise, golf, tennis, and yard work!)  Use a sunscreen that says "broad spectrum" so it covers both UVA and UVB rays, and make sure to reapply every 1-2 hours.  Remember to change the batteries in your smoke detectors when changing your clock times in the spring and fall.  Use your seat belt every time you are in a car, and please drive safely and not be distracted with cell phones and texting while driving.   Kelsey Fuller , Thank you for taking time to come for your Medicare Wellness Visit. I appreciate your ongoing commitment to your health goals. Please review the following  plan we discussed and let me know if I can assist you in the future.   These are the goals we discussed: Goals    None      This is a list of the screening recommended for you and due dates:  Health Maintenance  Topic Date Due  . Flu Shot  11/09/2016  . Pneumonia vaccines (2 of 2 - PPSV23) 12/08/2016  . Tetanus Vaccine  06/03/2025  . DEXA scan (bone density measurement)  Completed   You got your flu shot and pneumovax today. We are referring you for Cologard testing for colon cancer screening.  If this is abnormal, you will need to follow up with Dr. Collene Mares for colonoscopy. You are past due for your mammogram--please call to schedule this right away.  I recommend getting the new shingles vaccine (Shingrix). You will need to check with your insurance to see if it is covered, and if covered by Medicare Part D, you need to get from the pharmacy rather than our office.  It is a series of 2 injections, spaced 2 months apart.  I agree with your concern over using vaginal estrogen given your history of breast cancer.  I would discuss this with your oncologist (if using just topically in peri-urethral area may not be as bad as full applicator use internally).  Please be sure to restart the daily Vitamin D3 1000 IU every day.  Your breathing test today  still was far from normal. I'm changing you to a new inhaler instead of your current combination.  You may finish up the Flovent (but increase to 2 puff twice daily, and remember to rinse mouth after use) and the spiriva that you have at home. Once you run out of the spiriva, switch to Anoro.  The anoro has 2 medications--one that is similar to spiriva, and one that is similar to a long-acting albuterol. This one does not have steroid in it (you don't need to rinse your mouth).  I'd like for you to return in 6 months (so, having used this medication for about 3-4 months), and recheck your breathing.  We can make further adjustments to your medication  based on those results (there is a combination inhaler that contains all 3 medications--I'm not sure you need that at this point).

## 2017-01-18 DIAGNOSIS — Z1212 Encounter for screening for malignant neoplasm of rectum: Secondary | ICD-10-CM | POA: Diagnosis not present

## 2017-01-18 DIAGNOSIS — Z1211 Encounter for screening for malignant neoplasm of colon: Secondary | ICD-10-CM | POA: Diagnosis not present

## 2017-01-26 LAB — COLOGUARD: COLOGUARD: NEGATIVE

## 2017-02-08 DIAGNOSIS — R3914 Feeling of incomplete bladder emptying: Secondary | ICD-10-CM | POA: Diagnosis not present

## 2017-02-08 DIAGNOSIS — N200 Calculus of kidney: Secondary | ICD-10-CM | POA: Diagnosis not present

## 2017-02-08 DIAGNOSIS — N3020 Other chronic cystitis without hematuria: Secondary | ICD-10-CM | POA: Diagnosis not present

## 2017-02-08 DIAGNOSIS — N952 Postmenopausal atrophic vaginitis: Secondary | ICD-10-CM | POA: Diagnosis not present

## 2017-02-09 ENCOUNTER — Encounter: Payer: Self-pay | Admitting: *Deleted

## 2017-03-16 ENCOUNTER — Other Ambulatory Visit: Payer: Self-pay

## 2017-03-20 ENCOUNTER — Encounter (HOSPITAL_COMMUNITY): Admission: EM | Disposition: E | Payer: Self-pay | Source: Home / Self Care | Attending: Emergency Medicine

## 2017-03-20 ENCOUNTER — Emergency Department (HOSPITAL_COMMUNITY): Payer: Medicare Other | Admitting: Anesthesiology

## 2017-03-20 ENCOUNTER — Emergency Department (HOSPITAL_COMMUNITY)
Admission: EM | Admit: 2017-03-20 | Discharge: 2017-04-11 | Disposition: E | Payer: Medicare Other | Attending: Emergency Medicine | Admitting: Emergency Medicine

## 2017-03-20 ENCOUNTER — Emergency Department (HOSPITAL_COMMUNITY): Payer: Medicare Other

## 2017-03-20 ENCOUNTER — Encounter (HOSPITAL_COMMUNITY): Payer: Self-pay

## 2017-03-20 DIAGNOSIS — I959 Hypotension, unspecified: Secondary | ICD-10-CM | POA: Diagnosis not present

## 2017-03-20 DIAGNOSIS — Z880 Allergy status to penicillin: Secondary | ICD-10-CM | POA: Insufficient documentation

## 2017-03-20 DIAGNOSIS — R531 Weakness: Secondary | ICD-10-CM | POA: Diagnosis not present

## 2017-03-20 DIAGNOSIS — M859 Disorder of bone density and structure, unspecified: Secondary | ICD-10-CM | POA: Diagnosis not present

## 2017-03-20 DIAGNOSIS — R110 Nausea: Secondary | ICD-10-CM | POA: Diagnosis not present

## 2017-03-20 DIAGNOSIS — I7121 Aneurysm of the ascending aorta, without rupture: Secondary | ICD-10-CM

## 2017-03-20 DIAGNOSIS — Z87891 Personal history of nicotine dependence: Secondary | ICD-10-CM | POA: Diagnosis not present

## 2017-03-20 DIAGNOSIS — I712 Thoracic aortic aneurysm, without rupture: Secondary | ICD-10-CM | POA: Diagnosis not present

## 2017-03-20 DIAGNOSIS — M810 Age-related osteoporosis without current pathological fracture: Secondary | ICD-10-CM | POA: Diagnosis not present

## 2017-03-20 DIAGNOSIS — Z882 Allergy status to sulfonamides status: Secondary | ICD-10-CM | POA: Insufficient documentation

## 2017-03-20 DIAGNOSIS — R51 Headache: Secondary | ICD-10-CM | POA: Insufficient documentation

## 2017-03-20 DIAGNOSIS — I7101 Dissection of thoracic aorta: Secondary | ICD-10-CM | POA: Insufficient documentation

## 2017-03-20 DIAGNOSIS — D696 Thrombocytopenia, unspecified: Secondary | ICD-10-CM | POA: Diagnosis not present

## 2017-03-20 DIAGNOSIS — I313 Pericardial effusion (noninflammatory): Secondary | ICD-10-CM | POA: Insufficient documentation

## 2017-03-20 DIAGNOSIS — Z7982 Long term (current) use of aspirin: Secondary | ICD-10-CM | POA: Insufficient documentation

## 2017-03-20 DIAGNOSIS — Z79899 Other long term (current) drug therapy: Secondary | ICD-10-CM | POA: Diagnosis not present

## 2017-03-20 DIAGNOSIS — Z881 Allergy status to other antibiotic agents status: Secondary | ICD-10-CM | POA: Insufficient documentation

## 2017-03-20 DIAGNOSIS — J449 Chronic obstructive pulmonary disease, unspecified: Secondary | ICD-10-CM | POA: Diagnosis not present

## 2017-03-20 DIAGNOSIS — M1990 Unspecified osteoarthritis, unspecified site: Secondary | ICD-10-CM | POA: Diagnosis not present

## 2017-03-20 DIAGNOSIS — N200 Calculus of kidney: Secondary | ICD-10-CM | POA: Diagnosis not present

## 2017-03-20 DIAGNOSIS — C50211 Malignant neoplasm of upper-inner quadrant of right female breast: Secondary | ICD-10-CM | POA: Diagnosis not present

## 2017-03-20 DIAGNOSIS — H409 Unspecified glaucoma: Secondary | ICD-10-CM | POA: Insufficient documentation

## 2017-03-20 DIAGNOSIS — I340 Nonrheumatic mitral (valve) insufficiency: Secondary | ICD-10-CM | POA: Diagnosis not present

## 2017-03-20 DIAGNOSIS — Z853 Personal history of malignant neoplasm of breast: Secondary | ICD-10-CM | POA: Diagnosis not present

## 2017-03-20 DIAGNOSIS — I719 Aortic aneurysm of unspecified site, without rupture: Secondary | ICD-10-CM | POA: Diagnosis not present

## 2017-03-20 DIAGNOSIS — R0602 Shortness of breath: Secondary | ICD-10-CM | POA: Diagnosis not present

## 2017-03-20 HISTORY — PX: AORTIC VALVE REPLACEMENT: SHX41

## 2017-03-20 HISTORY — PX: CORONARY ARTERY BYPASS GRAFT: SHX141

## 2017-03-20 HISTORY — PX: THORACIC AORTIC ANEURYSM REPAIR: SHX799

## 2017-03-20 LAB — POCT I-STAT 3, ART BLOOD GAS (G3+)
ACID-BASE DEFICIT: 14 mmol/L — AB (ref 0.0–2.0)
ACID-BASE DEFICIT: 16 mmol/L — AB (ref 0.0–2.0)
ACID-BASE DEFICIT: 8 mmol/L — AB (ref 0.0–2.0)
ACID-BASE DEFICIT: 8 mmol/L — AB (ref 0.0–2.0)
ACID-BASE DEFICIT: 15 mmol/L — AB (ref 0.0–2.0)
ACID-BASE DEFICIT: 9 mmol/L — AB (ref 0.0–2.0)
Acid-Base Excess: 6 mmol/L — ABNORMAL HIGH (ref 0.0–2.0)
Acid-Base Excess: 6 mmol/L — ABNORMAL HIGH (ref 0.0–2.0)
Acid-base deficit: 4 mmol/L — ABNORMAL HIGH (ref 0.0–2.0)
Acid-base deficit: 6 mmol/L — ABNORMAL HIGH (ref 0.0–2.0)
Acid-base deficit: 8 mmol/L — ABNORMAL HIGH (ref 0.0–2.0)
Acid-base deficit: 5 mmol/L — ABNORMAL HIGH (ref 0.0–2.0)
Acid-base deficit: 5 mmol/L — ABNORMAL HIGH (ref 0.0–2.0)
BICARBONATE: 13.9 mmol/L — AB (ref 20.0–28.0)
BICARBONATE: 18.5 mmol/L — AB (ref 20.0–28.0)
BICARBONATE: 21.4 mmol/L (ref 20.0–28.0)
BICARBONATE: 15.4 mmol/L — AB (ref 20.0–28.0)
BICARBONATE: 19.5 mmol/L — AB (ref 20.0–28.0)
BICARBONATE: 17.5 mmol/L — AB (ref 20.0–28.0)
Bicarbonate: 9.9 mmol/L — ABNORMAL LOW (ref 20.0–28.0)
Bicarbonate: 18.9 mmol/L — ABNORMAL LOW (ref 20.0–28.0)
Bicarbonate: 29.5 mmol/L — ABNORMAL HIGH (ref 20.0–28.0)
Bicarbonate: 29.2 mmol/L — ABNORMAL HIGH (ref 20.0–28.0)
Bicarbonate: 11.4 mmol/L — ABNORMAL LOW (ref 20.0–28.0)
Bicarbonate: 18 mmol/L — ABNORMAL LOW (ref 20.0–28.0)
Bicarbonate: 21.4 mmol/L (ref 20.0–28.0)
O2 SAT: 100 %
O2 SAT: 100 %
O2 SAT: 100 %
O2 SAT: 100 %
O2 SAT: 100 %
O2 SAT: 100 %
O2 SAT: 100 %
O2 SAT: 100 %
O2 Saturation: 100 %
O2 Saturation: 100 %
O2 Saturation: 100 %
O2 Saturation: 100 %
O2 Saturation: 100 %
PCO2 ART: 22.9 mmHg — AB (ref 32.0–48.0)
PCO2 ART: 32.6 mmHg (ref 32.0–48.0)
PCO2 ART: 24.1 mmHg — AB (ref 32.0–48.0)
PCO2 ART: 29.5 mmHg — AB (ref 32.0–48.0)
PCO2 ART: 32.1 mmHg (ref 32.0–48.0)
PCO2 ART: 27.7 mmHg — AB (ref 32.0–48.0)
PH ART: 7.131 — AB (ref 7.350–7.450)
PH ART: 7.269 — AB (ref 7.350–7.450)
PH ART: 7.331 — AB (ref 7.350–7.450)
PH ART: 7.342 — AB (ref 7.350–7.450)
PH ART: 7.503 — AB (ref 7.350–7.450)
PH ART: 7.427 (ref 7.350–7.450)
PH ART: 7.292 — AB (ref 7.350–7.450)
PO2 ART: 465 mmHg — AB (ref 83.0–108.0)
PO2 ART: 282 mmHg — AB (ref 83.0–108.0)
PO2 ART: 295 mmHg — AB (ref 83.0–108.0)
PO2 ART: 281 mmHg — AB (ref 83.0–108.0)
PO2 ART: 306 mmHg — AB (ref 83.0–108.0)
PO2 ART: 409 mmHg — AB (ref 83.0–108.0)
TCO2: 15 mmol/L — AB (ref 22–32)
TCO2: 11 mmol/L — ABNORMAL LOW (ref 22–32)
TCO2: 20 mmol/L — ABNORMAL LOW (ref 22–32)
TCO2: 20 mmol/L — ABNORMAL LOW (ref 22–32)
TCO2: 23 mmol/L (ref 22–32)
TCO2: 31 mmol/L (ref 22–32)
TCO2: 30 mmol/L (ref 22–32)
TCO2: 16 mmol/L — AB (ref 22–32)
TCO2: 20 mmol/L — AB (ref 22–32)
TCO2: 19 mmol/L — ABNORMAL LOW (ref 22–32)
TCO2: 12 mmol/L — AB (ref 22–32)
TCO2: 19 mmol/L — AB (ref 22–32)
TCO2: 23 mmol/L (ref 22–32)
pCO2 arterial: 41.8 mmHg (ref 32.0–48.0)
pCO2 arterial: 40.4 mmHg (ref 32.0–48.0)
pCO2 arterial: 34.9 mmHg (ref 32.0–48.0)
pCO2 arterial: 37.6 mmHg (ref 32.0–48.0)
pCO2 arterial: 40.4 mmHg (ref 32.0–48.0)
pCO2 arterial: 48.8 mmHg — ABNORMAL HIGH (ref 32.0–48.0)
pCO2 arterial: 44.3 mmHg (ref 32.0–48.0)
pH, Arterial: 7.246 — ABNORMAL LOW (ref 7.350–7.450)
pH, Arterial: 7.561 — ABNORMAL HIGH (ref 7.350–7.450)
pH, Arterial: 7.414 (ref 7.350–7.450)
pH, Arterial: 7.345 — ABNORMAL LOW (ref 7.350–7.450)
pH, Arterial: 7.175 — CL (ref 7.350–7.450)
pH, Arterial: 7.221 — ABNORMAL LOW (ref 7.350–7.450)
pO2, Arterial: 520 mmHg — ABNORMAL HIGH (ref 83.0–108.0)
pO2, Arterial: 528 mmHg — ABNORMAL HIGH (ref 83.0–108.0)
pO2, Arterial: 341 mmHg — ABNORMAL HIGH (ref 83.0–108.0)
pO2, Arterial: 658 mmHg — ABNORMAL HIGH (ref 83.0–108.0)
pO2, Arterial: 339 mmHg — ABNORMAL HIGH (ref 83.0–108.0)
pO2, Arterial: 322 mmHg — ABNORMAL HIGH (ref 83.0–108.0)
pO2, Arterial: 299 mmHg — ABNORMAL HIGH (ref 83.0–108.0)

## 2017-03-20 LAB — POCT I-STAT, CHEM 8
BUN: 15 mg/dL (ref 6–20)
BUN: 16 mg/dL (ref 6–20)
BUN: 15 mg/dL (ref 6–20)
BUN: 15 mg/dL (ref 6–20)
BUN: 15 mg/dL (ref 6–20)
BUN: 15 mg/dL (ref 6–20)
BUN: 14 mg/dL (ref 6–20)
BUN: 11 mg/dL (ref 6–20)
BUN: 13 mg/dL (ref 6–20)
BUN: 12 mg/dL (ref 6–20)
CALCIUM ION: 1.05 mmol/L — AB (ref 1.15–1.40)
CALCIUM ION: 1.01 mmol/L — AB (ref 1.15–1.40)
CALCIUM ION: 0.56 mmol/L — AB (ref 1.15–1.40)
CALCIUM ION: 0.57 mmol/L — AB (ref 1.15–1.40)
CALCIUM ION: 0.59 mmol/L — AB (ref 1.15–1.40)
CALCIUM ION: 0.91 mmol/L — AB (ref 1.15–1.40)
CALCIUM ION: 1.07 mmol/L — AB (ref 1.15–1.40)
CALCIUM ION: 0.7 mmol/L — AB (ref 1.15–1.40)
CHLORIDE: 113 mmol/L — AB (ref 101–111)
CHLORIDE: 107 mmol/L (ref 101–111)
CHLORIDE: 103 mmol/L (ref 101–111)
CHLORIDE: 101 mmol/L (ref 101–111)
CHLORIDE: 106 mmol/L (ref 101–111)
CHLORIDE: 110 mmol/L (ref 101–111)
CREATININE: 0.7 mg/dL (ref 0.44–1.00)
CREATININE: 0.7 mg/dL (ref 0.44–1.00)
CREATININE: 0.5 mg/dL (ref 0.44–1.00)
Calcium, Ion: 0.55 mmol/L — CL (ref 1.15–1.40)
Calcium, Ion: 0.59 mmol/L — CL (ref 1.15–1.40)
Chloride: 110 mmol/L (ref 101–111)
Chloride: 107 mmol/L (ref 101–111)
Chloride: 111 mmol/L (ref 101–111)
Chloride: 107 mmol/L (ref 101–111)
Creatinine, Ser: 0.4 mg/dL — ABNORMAL LOW (ref 0.44–1.00)
Creatinine, Ser: 0.5 mg/dL (ref 0.44–1.00)
Creatinine, Ser: 0.5 mg/dL (ref 0.44–1.00)
Creatinine, Ser: 0.5 mg/dL (ref 0.44–1.00)
Creatinine, Ser: 0.6 mg/dL (ref 0.44–1.00)
Creatinine, Ser: 0.6 mg/dL (ref 0.44–1.00)
Creatinine, Ser: 0.7 mg/dL (ref 0.44–1.00)
GLUCOSE: 163 mg/dL — AB (ref 65–99)
GLUCOSE: 180 mg/dL — AB (ref 65–99)
GLUCOSE: 216 mg/dL — AB (ref 65–99)
GLUCOSE: 223 mg/dL — AB (ref 65–99)
GLUCOSE: 126 mg/dL — AB (ref 65–99)
GLUCOSE: 146 mg/dL — AB (ref 65–99)
GLUCOSE: 147 mg/dL — AB (ref 65–99)
Glucose, Bld: 183 mg/dL — ABNORMAL HIGH (ref 65–99)
Glucose, Bld: 250 mg/dL — ABNORMAL HIGH (ref 65–99)
Glucose, Bld: 205 mg/dL — ABNORMAL HIGH (ref 65–99)
HCT: 28 % — ABNORMAL LOW (ref 36.0–46.0)
HCT: 30 % — ABNORMAL LOW (ref 36.0–46.0)
HCT: 22 % — ABNORMAL LOW (ref 36.0–46.0)
HCT: 21 % — ABNORMAL LOW (ref 36.0–46.0)
HCT: 21 % — ABNORMAL LOW (ref 36.0–46.0)
HCT: 20 % — ABNORMAL LOW (ref 36.0–46.0)
HCT: 21 % — ABNORMAL LOW (ref 36.0–46.0)
HEMATOCRIT: 18 % — AB (ref 36.0–46.0)
HEMATOCRIT: 23 % — AB (ref 36.0–46.0)
HEMATOCRIT: 20 % — AB (ref 36.0–46.0)
HEMOGLOBIN: 10.2 g/dL — AB (ref 12.0–15.0)
HEMOGLOBIN: 7.5 g/dL — AB (ref 12.0–15.0)
HEMOGLOBIN: 7.8 g/dL — AB (ref 12.0–15.0)
HEMOGLOBIN: 7.1 g/dL — AB (ref 12.0–15.0)
HEMOGLOBIN: 7.1 g/dL — AB (ref 12.0–15.0)
Hemoglobin: 9.5 g/dL — ABNORMAL LOW (ref 12.0–15.0)
Hemoglobin: 6.1 g/dL — CL (ref 12.0–15.0)
Hemoglobin: 7.1 g/dL — ABNORMAL LOW (ref 12.0–15.0)
Hemoglobin: 6.8 g/dL — CL (ref 12.0–15.0)
Hemoglobin: 6.8 g/dL — CL (ref 12.0–15.0)
POTASSIUM: 3.5 mmol/L (ref 3.5–5.1)
POTASSIUM: 6.9 mmol/L — AB (ref 3.5–5.1)
POTASSIUM: 6.9 mmol/L — AB (ref 3.5–5.1)
Potassium: 3.3 mmol/L — ABNORMAL LOW (ref 3.5–5.1)
Potassium: 8 mmol/L (ref 3.5–5.1)
Potassium: 8.4 mmol/L (ref 3.5–5.1)
Potassium: 5.1 mmol/L (ref 3.5–5.1)
Potassium: 3.7 mmol/L (ref 3.5–5.1)
Potassium: 4.7 mmol/L (ref 3.5–5.1)
Potassium: 6.2 mmol/L — ABNORMAL HIGH (ref 3.5–5.1)
SODIUM: 145 mmol/L (ref 135–145)
SODIUM: 138 mmol/L (ref 135–145)
SODIUM: 149 mmol/L — AB (ref 135–145)
Sodium: 144 mmol/L (ref 135–145)
Sodium: 144 mmol/L (ref 135–145)
Sodium: 141 mmol/L (ref 135–145)
Sodium: 143 mmol/L (ref 135–145)
Sodium: 147 mmol/L — ABNORMAL HIGH (ref 135–145)
Sodium: 150 mmol/L — ABNORMAL HIGH (ref 135–145)
Sodium: 151 mmol/L — ABNORMAL HIGH (ref 135–145)
TCO2: 18 mmol/L — ABNORMAL LOW (ref 22–32)
TCO2: 20 mmol/L — ABNORMAL LOW (ref 22–32)
TCO2: 21 mmol/L — AB (ref 22–32)
TCO2: 23 mmol/L (ref 22–32)
TCO2: 32 mmol/L (ref 22–32)
TCO2: 17 mmol/L — ABNORMAL LOW (ref 22–32)
TCO2: 22 mmol/L (ref 22–32)
TCO2: 14 mmol/L — ABNORMAL LOW (ref 22–32)
TCO2: 21 mmol/L — AB (ref 22–32)
TCO2: 23 mmol/L (ref 22–32)

## 2017-03-20 LAB — CBC WITH DIFFERENTIAL/PLATELET
BASOS ABS: 0 10*3/uL (ref 0.0–0.1)
BASOS PCT: 0 %
Eosinophils Absolute: 0.4 10*3/uL (ref 0.0–0.7)
Eosinophils Relative: 4 %
HEMATOCRIT: 42.3 % (ref 36.0–46.0)
HEMOGLOBIN: 13.7 g/dL (ref 12.0–15.0)
LYMPHS PCT: 27 %
Lymphs Abs: 3 10*3/uL (ref 0.7–4.0)
MCH: 29.9 pg (ref 26.0–34.0)
MCHC: 32.4 g/dL (ref 30.0–36.0)
MCV: 92.4 fL (ref 78.0–100.0)
MONO ABS: 0.6 10*3/uL (ref 0.1–1.0)
Monocytes Relative: 5 %
NEUTROS ABS: 6.9 10*3/uL (ref 1.7–7.7)
NEUTROS PCT: 64 %
Platelets: 179 10*3/uL (ref 150–400)
RBC: 4.58 MIL/uL (ref 3.87–5.11)
RDW: 14.3 % (ref 11.5–15.5)
WBC: 10.9 10*3/uL — AB (ref 4.0–10.5)

## 2017-03-20 LAB — COMPREHENSIVE METABOLIC PANEL
ALBUMIN: 3.1 g/dL — AB (ref 3.5–5.0)
ALK PHOS: 84 U/L (ref 38–126)
ALT: 65 U/L — AB (ref 14–54)
AST: 106 U/L — AB (ref 15–41)
Anion gap: 13 (ref 5–15)
BILIRUBIN TOTAL: 0.7 mg/dL (ref 0.3–1.2)
BUN: 19 mg/dL (ref 6–20)
CO2: 14 mmol/L — ABNORMAL LOW (ref 22–32)
CREATININE: 1.11 mg/dL — AB (ref 0.44–1.00)
Calcium: 8.1 mg/dL — ABNORMAL LOW (ref 8.9–10.3)
Chloride: 110 mmol/L (ref 101–111)
GFR calc Af Amer: 54 mL/min — ABNORMAL LOW (ref >60)
GFR, EST NON AFRICAN AMERICAN: 47 mL/min — AB (ref >60)
GLUCOSE: 294 mg/dL — AB (ref 65–99)
POTASSIUM: 4.1 mmol/L (ref 3.5–5.1)
Sodium: 137 mmol/L (ref 135–145)
TOTAL PROTEIN: 5.8 g/dL — AB (ref 6.5–8.1)

## 2017-03-20 LAB — FIBRINOGEN: Fibrinogen: <60 mg/dL — CL (ref 210–475)

## 2017-03-20 LAB — CBG MONITORING, ED: Glucose-Capillary: 273 mg/dL — ABNORMAL HIGH (ref 65–99)

## 2017-03-20 LAB — HEMOGLOBIN AND HEMATOCRIT, BLOOD
HCT: 25.1 % — ABNORMAL LOW (ref 36.0–46.0)
HEMATOCRIT: 25 % — AB (ref 36.0–46.0)
HEMOGLOBIN: 8.4 g/dL — AB (ref 12.0–15.0)
HEMOGLOBIN: 8.2 g/dL — AB (ref 12.0–15.0)

## 2017-03-20 LAB — PLATELET COUNT
PLATELETS: 11 10*3/uL — AB (ref 150–400)
Platelets: 25 10*3/uL — CL (ref 150–400)

## 2017-03-20 LAB — POTASSIUM: Potassium: 6.9 mmol/L (ref 3.5–5.1)

## 2017-03-20 LAB — TROPONIN I

## 2017-03-20 LAB — I-STAT CG4 LACTIC ACID, ED: Lactic Acid, Venous: 4.29 mmol/L (ref 0.5–1.9)

## 2017-03-20 LAB — ABO/RH: ABO/RH(D): A POS

## 2017-03-20 LAB — LIPASE, BLOOD: Lipase: 37 U/L (ref 11–51)

## 2017-03-20 LAB — PREPARE RBC (CROSSMATCH)

## 2017-03-20 SURGERY — REPAIR, ANEURYSM, AORTA, THORACIC, ASCENDING
Anesthesia: General | Site: Chest

## 2017-03-20 MED ORDER — EPHEDRINE 5 MG/ML INJ
INTRAVENOUS | Status: AC
  Filled 2017-03-20: qty 10

## 2017-03-20 MED ORDER — TRANEXAMIC ACID (OHS) PUMP PRIME SOLUTION
2.0000 mg/kg | INTRAVENOUS | Status: AC
  Filled 2017-03-20: qty 0.95

## 2017-03-20 MED ORDER — FENTANYL CITRATE (PF) 250 MCG/5ML IJ SOLN
INTRAMUSCULAR | Status: AC
  Filled 2017-03-20: qty 20

## 2017-03-20 MED ORDER — SODIUM CHLORIDE 0.9 % IV SOLN
INTRAVENOUS | Status: DC | PRN
  Administered 2017-03-20 (×2): via INTRAVENOUS

## 2017-03-20 MED ORDER — PLASMA-LYTE 148 IV SOLN
INTRAVENOUS | Status: AC
  Administered 2017-03-20: 13:00:00
  Filled 2017-03-20: qty 2.5

## 2017-03-20 MED ORDER — LEVOFLOXACIN IN D5W 500 MG/100ML IV SOLN
500.0000 mg | INTRAVENOUS | Status: AC
  Administered 2017-03-20: 500 mg via INTRAVENOUS
  Filled 2017-03-20 (×2): qty 100

## 2017-03-20 MED ORDER — LEVOFLOXACIN IN D5W 500 MG/100ML IV SOLN
500.0000 mg | INTRAVENOUS | Status: DC
  Filled 2017-03-20: qty 100

## 2017-03-20 MED ORDER — VASOPRESSIN 20 UNIT/ML IV SOLN
INTRAVENOUS | Status: AC
Start: 2017-03-20 — End: ?
  Filled 2017-03-20: qty 1

## 2017-03-20 MED ORDER — LIDOCAINE 2% (20 MG/ML) 5 ML SYRINGE
INTRAMUSCULAR | Status: AC
  Filled 2017-03-20: qty 15

## 2017-03-20 MED ORDER — EPINEPHRINE PF 1 MG/ML IJ SOLN
INTRAMUSCULAR | Status: DC | PRN
  Administered 2017-03-20 (×3): 1 mg

## 2017-03-20 MED ORDER — SODIUM CHLORIDE 0.9 % IV BOLUS (SEPSIS)
1000.0000 mL | Freq: Once | INTRAVENOUS | Status: AC
  Administered 2017-03-20: 1000 mL via INTRAVENOUS

## 2017-03-20 MED ORDER — PROTAMINE SULFATE 10 MG/ML IV SOLN
INTRAVENOUS | Status: AC
  Filled 2017-03-20: qty 50

## 2017-03-20 MED ORDER — MIDAZOLAM HCL 10 MG/2ML IJ SOLN
INTRAMUSCULAR | Status: AC
  Filled 2017-03-20: qty 2

## 2017-03-20 MED ORDER — TRANEXAMIC ACID 1000 MG/10ML IV SOLN
1.5000 mg/kg/h | INTRAVENOUS | Status: AC
  Administered 2017-03-20: 1.5 mg/kg/h via INTRAVENOUS
  Filled 2017-03-20: qty 25

## 2017-03-20 MED ORDER — DOPAMINE-DEXTROSE 3.2-5 MG/ML-% IV SOLN
0.0000 ug/kg/min | INTRAVENOUS | Status: AC
  Administered 2017-03-20: 3 ug/kg/min via INTRAVENOUS
  Filled 2017-03-20: qty 250

## 2017-03-20 MED ORDER — SODIUM CHLORIDE 0.9 % IV SOLN: Freq: Once | INTRAVENOUS | Status: DC

## 2017-03-20 MED ORDER — MAGNESIUM SULFATE 50 % IJ SOLN
40.0000 meq | INTRAMUSCULAR | Status: AC
  Filled 2017-03-20: qty 9.85

## 2017-03-20 MED ORDER — DEXTROSE IN LACTATED RINGERS 5 % IV SOLN
INTRAVENOUS | Status: DC
  Filled 2017-03-20: qty 1000

## 2017-03-20 MED ORDER — FENTANYL CITRATE (PF) 250 MCG/5ML IJ SOLN
INTRAMUSCULAR | Status: DC | PRN
  Administered 2017-03-20: 100 ug via INTRAVENOUS
  Administered 2017-03-20: 150 ug via INTRAVENOUS
  Administered 2017-03-20 (×2): 50 ug via INTRAVENOUS
  Administered 2017-03-20: 250 ug via INTRAVENOUS
  Administered 2017-03-20: 150 ug via INTRAVENOUS

## 2017-03-20 MED ORDER — HEPARIN SODIUM (PORCINE) 1000 UNIT/ML IJ SOLN
INTRAMUSCULAR | Status: DC | PRN
  Administered 2017-03-20: 5000 [IU] via INTRAVENOUS
  Administered 2017-03-20: 9000 [IU] via INTRAVENOUS
  Administered 2017-03-20: 15000 [IU] via INTRAVENOUS

## 2017-03-20 MED ORDER — AMIODARONE IV BOLUS ONLY 150 MG/100ML
INTRAVENOUS | Status: DC | PRN
  Administered 2017-03-20: 150 mg via INTRAVENOUS

## 2017-03-20 MED ORDER — PROPOFOL 10 MG/ML IV BOLUS
INTRAVENOUS | Status: DC | PRN
  Administered 2017-03-20: 50 mg via INTRAVENOUS
  Administered 2017-03-20: 30 mg via INTRAVENOUS
  Administered 2017-03-20: 40 mg via INTRAVENOUS
  Administered 2017-03-20: 80 mg via INTRAVENOUS

## 2017-03-20 MED ORDER — 0.9 % SODIUM CHLORIDE (POUR BTL) OPTIME
TOPICAL | Status: DC | PRN
Start: 2017-03-20 — End: 2017-03-20
  Administered 2017-03-20: 4000 mL

## 2017-03-20 MED ORDER — IOPAMIDOL (ISOVUE-370) INJECTION 76%
INTRAVENOUS | Status: AC
  Administered 2017-03-20: 100 mL
  Filled 2017-03-20: qty 100

## 2017-03-20 MED ORDER — PROTAMINE SULFATE 10 MG/ML IV SOLN
INTRAVENOUS | Status: DC | PRN
  Administered 2017-03-20: 130 mg via INTRAVENOUS
  Administered 2017-03-20: 20 mg via INTRAVENOUS
  Administered 2017-03-20: 130 mg via INTRAVENOUS
  Administered 2017-03-20: 20 mg via INTRAVENOUS

## 2017-03-20 MED ORDER — EPINEPHRINE PF 1 MG/10ML IJ SOSY
PREFILLED_SYRINGE | INTRAMUSCULAR | Status: AC
  Filled 2017-03-20: qty 10

## 2017-03-20 MED ORDER — SODIUM BICARBONATE 8.4 % IV SOLN
INTRAVENOUS | Status: DC | PRN
  Administered 2017-03-20 (×2): 50 meq via INTRAVENOUS

## 2017-03-20 MED ORDER — SODIUM CHLORIDE 0.9 % IV SOLN
INTRAVENOUS | Status: AC
  Administered 2017-03-20: 2.2 [IU]/h via INTRAVENOUS
  Filled 2017-03-20: qty 1

## 2017-03-20 MED ORDER — POTASSIUM CHLORIDE 2 MEQ/ML IV SOLN
80.0000 meq | INTRAVENOUS | Status: DC
  Filled 2017-03-20: qty 40

## 2017-03-20 MED ORDER — SODIUM CHLORIDE 0.9 % IV SOLN
30.0000 ug/min | INTRAVENOUS | Status: AC
  Administered 2017-03-20: 25 ug/min via INTRAVENOUS
  Administered 2017-03-20: 50 ug/min via INTRAVENOUS
  Filled 2017-03-20: qty 2

## 2017-03-20 MED ORDER — HEMOSTATIC AGENTS (NO CHARGE) OPTIME
TOPICAL | Status: DC | PRN
  Administered 2017-03-20 (×9): 1 via TOPICAL

## 2017-03-20 MED ORDER — EPINEPHRINE PF 1 MG/ML IJ SOLN
INTRAMUSCULAR | Status: AC
  Filled 2017-03-20: qty 1

## 2017-03-20 MED ORDER — MILRINONE LACTATE IN DEXTROSE 20-5 MG/100ML-% IV SOLN
0.1250 ug/kg/min | INTRAVENOUS | Status: AC
  Administered 2017-03-20: .3 ug/kg/min via INTRAVENOUS
  Filled 2017-03-20 (×2): qty 100

## 2017-03-20 MED ORDER — VASOPRESSIN 20 UNIT/ML IV SOLN
INTRAVENOUS | Status: DC | PRN
  Administered 2017-03-20: 2.5 [IU] via INTRAVENOUS
  Administered 2017-03-20: 5 [IU] via INTRAVENOUS
  Administered 2017-03-20: 2.5 [IU] via INTRAVENOUS

## 2017-03-20 MED ORDER — ARTIFICIAL TEARS OPHTHALMIC OINT
TOPICAL_OINTMENT | OPHTHALMIC | Status: DC | PRN
  Administered 2017-03-20: 1 via OPHTHALMIC

## 2017-03-20 MED ORDER — ROCURONIUM BROMIDE 100 MG/10ML IV SOLN
INTRAVENOUS | Status: DC | PRN
Start: 2017-03-20 — End: 2017-03-20

## 2017-03-20 MED ORDER — PHENYLEPHRINE 40 MCG/ML (10ML) SYRINGE FOR IV PUSH (FOR BLOOD PRESSURE SUPPORT)
PREFILLED_SYRINGE | INTRAVENOUS | Status: DC | PRN
  Administered 2017-03-20: 40 ug via INTRAVENOUS

## 2017-03-20 MED ORDER — SODIUM CHLORIDE 0.9 % IV SOLN
1250.0000 mg | INTRAVENOUS | Status: AC
  Administered 2017-03-20: 1250 mg via INTRAVENOUS
  Filled 2017-03-20 (×2): qty 1250

## 2017-03-20 MED ORDER — MIDAZOLAM HCL 5 MG/5ML IJ SOLN
INTRAMUSCULAR | Status: DC | PRN
  Administered 2017-03-20: 4 mg via INTRAVENOUS
  Administered 2017-03-20: 2 mg via INTRAVENOUS

## 2017-03-20 MED ORDER — SODIUM CHLORIDE 0.9 % IV SOLN
INTRAVENOUS | Status: AC
  Filled 2017-03-20: qty 30

## 2017-03-20 MED ORDER — LACTATED RINGERS IV SOLN
INTRAVENOUS | Status: DC | PRN
  Administered 2017-03-20 (×4): via INTRAVENOUS

## 2017-03-20 MED ORDER — HYDROCORTISONE NA SUCCINATE PF 100 MG IJ SOLR
INTRAMUSCULAR | Status: DC | PRN
  Administered 2017-03-20: 250 mg via INTRAVENOUS

## 2017-03-20 MED ORDER — SUCCINYLCHOLINE CHLORIDE 200 MG/10ML IV SOSY
PREFILLED_SYRINGE | INTRAVENOUS | Status: DC | PRN
  Administered 2017-03-20: 80 mg via INTRAVENOUS

## 2017-03-20 MED ORDER — HEPARIN SODIUM (PORCINE) 1000 UNIT/ML IJ SOLN
INTRAMUSCULAR | Status: AC
Start: 2017-03-20 — End: ?
  Filled 2017-03-20: qty 1

## 2017-03-20 MED ORDER — ETOMIDATE 2 MG/ML IV SOLN
INTRAVENOUS | Status: DC | PRN
  Administered 2017-03-20: 20 mg via INTRAVENOUS

## 2017-03-20 MED ORDER — NITROGLYCERIN IN D5W 200-5 MCG/ML-% IV SOLN
2.0000 ug/min | INTRAVENOUS | Status: AC
  Administered 2017-03-20: 16.6 ug/min via INTRAVENOUS
  Filled 2017-03-20: qty 250

## 2017-03-20 MED ORDER — DEXMEDETOMIDINE HCL IN NACL 400 MCG/100ML IV SOLN
0.1000 ug/kg/h | INTRAVENOUS | Status: AC
  Administered 2017-03-20: .5 ug/kg/h via INTRAVENOUS
  Filled 2017-03-20: qty 100

## 2017-03-20 MED ORDER — EPINEPHRINE PF 1 MG/10ML IJ SOSY: PREFILLED_SYRINGE | INTRAMUSCULAR | Status: DC | PRN

## 2017-03-20 MED ORDER — PHENYLEPHRINE 40 MCG/ML (10ML) SYRINGE FOR IV PUSH (FOR BLOOD PRESSURE SUPPORT)
PREFILLED_SYRINGE | INTRAVENOUS | Status: AC
  Filled 2017-03-20: qty 30

## 2017-03-20 MED ORDER — LIDOCAINE 2% (20 MG/ML) 5 ML SYRINGE
INTRAMUSCULAR | Status: DC | PRN
  Administered 2017-03-20: 80 mg via INTRAVENOUS

## 2017-03-20 MED ORDER — POTASSIUM CHLORIDE 2 MEQ/ML IV SOLN
80.0000 meq | INTRAVENOUS | Status: AC
  Filled 2017-03-20: qty 40

## 2017-03-20 MED ORDER — LACTATED RINGERS IV SOLN
INTRAVENOUS | Status: DC | PRN
  Administered 2017-03-20: 13:00:00 via INTRAVENOUS

## 2017-03-20 MED ORDER — EPINEPHRINE PF 1 MG/ML IJ SOLN
0.0000 ug/min | INTRAMUSCULAR | Status: AC
  Filled 2017-03-20: qty 4

## 2017-03-20 MED ORDER — NOREPINEPHRINE BITARTRATE 1 MG/ML IV SOLN
0.0000 ug/min | INTRAVENOUS | Status: DC
  Administered 2017-03-20: 10 ug/min via INTRAVENOUS
  Administered 2017-03-20: 2 ug/min via INTRAVENOUS
  Filled 2017-03-20 (×2): qty 16

## 2017-03-20 MED ORDER — EPINEPHRINE PF 1 MG/10ML IJ SOSY
PREFILLED_SYRINGE | INTRAMUSCULAR | Status: DC | PRN
  Administered 2017-03-20: .04 mg via INTRAVENOUS
  Administered 2017-03-20: .02 mg via INTRAVENOUS
  Administered 2017-03-20: 1 mg via INTRAVENOUS
  Administered 2017-03-20: .02 mg via INTRAVENOUS
  Administered 2017-03-20: 0.2 mg via INTRAVENOUS
  Administered 2017-03-20: .02 mg via INTRAVENOUS
  Administered 2017-03-20 (×2): 0.5 mg via INTRAVENOUS

## 2017-03-20 MED ORDER — EPHEDRINE SULFATE-NACL 50-0.9 MG/10ML-% IV SOSY
PREFILLED_SYRINGE | INTRAVENOUS | Status: DC | PRN
  Administered 2017-03-20: 10 mg via INTRAVENOUS

## 2017-03-20 MED ORDER — CALCIUM CHLORIDE 10 % IV SOLN
INTRAVENOUS | Status: DC | PRN
  Administered 2017-03-20 (×2): 0.5 g via INTRAVENOUS
  Administered 2017-03-20: .5 g via INTRAVENOUS
  Administered 2017-03-20: 0.5 g via INTRAVENOUS

## 2017-03-20 MED ORDER — TRANEXAMIC ACID (OHS) BOLUS VIA INFUSION
15.0000 mg/kg | INTRAVENOUS | Status: AC
  Administered 2017-03-20: 714 mg via INTRAVENOUS
  Filled 2017-03-20: qty 714

## 2017-03-20 MED ORDER — DEXTROSE 5 % IV SOLN
INTRAVENOUS | Status: DC | PRN
  Administered 2017-03-20: 2 ug/min via INTRAVENOUS

## 2017-03-20 MED ORDER — ROCURONIUM BROMIDE 10 MG/ML (PF) SYRINGE
PREFILLED_SYRINGE | INTRAVENOUS | Status: DC | PRN
  Administered 2017-03-20: 100 mg via INTRAVENOUS
  Administered 2017-03-20 (×5): 50 mg via INTRAVENOUS

## 2017-03-20 MED ORDER — AMIODARONE HCL IN DEXTROSE 360-4.14 MG/200ML-% IV SOLN
INTRAVENOUS | Status: DC | PRN
  Administered 2017-03-20: 60 mg/h via INTRAVENOUS

## 2017-03-20 SURGICAL SUPPLY — 134 items
ADAPTER CARDIO PERF ANTE/RETRO (ADAPTER) ×4 IMPLANT
ADH SRG 12 PREFL SYR 3 SPRDR (MISCELLANEOUS) ×4
ADPR PRFSN 84XANTGRD RTRGD (ADAPTER) ×2
APL SKNCLS STERI-STRIP NONHPOA (GAUZE/BANDAGES/DRESSINGS)
BAG DECANTER FOR FLEXI CONT (MISCELLANEOUS) ×4 IMPLANT
BENZOIN TINCTURE PRP APPL 2/3 (GAUZE/BANDAGES/DRESSINGS) IMPLANT
BLADE CLIPPER SURG (BLADE) IMPLANT
BLADE MINI RND TIP GREEN BEAV (BLADE) ×2 IMPLANT
BLADE NDL 3 SS STRL (BLADE) IMPLANT
BLADE NEEDLE 3 SS STRL (BLADE) ×3 IMPLANT
BLADE NEEDLE 3MM SS STRL (BLADE) ×1
BLADE STERNUM SYSTEM 6 (BLADE) ×6 IMPLANT
BLADE SURG 15 STRL LF DISP TIS (BLADE) ×2 IMPLANT
BLADE SURG 15 STRL SS (BLADE) ×4
CABLE PACING FASLOC BLUE (MISCELLANEOUS) ×2 IMPLANT
CANISTER SUCT 3000ML PPV (MISCELLANEOUS) ×4 IMPLANT
CANN PRFSN .5XCNCT 15X34-48 (MISCELLANEOUS)
CANNULA AORTIC ROOT 9FR (CANNULA) ×2 IMPLANT
CANNULA ARTERIAL 007325 (MISCELLANEOUS) IMPLANT
CANNULA ARTERIAL 14F 007324 (MISCELLANEOUS) IMPLANT
CANNULA GRAFT 8MMX50CM (Graft) ×2 IMPLANT
CANNULA GUNDRY RCSP 15FR (MISCELLANEOUS) ×6 IMPLANT
CANNULA PRFSN .5XCNCT 15X34-48 (MISCELLANEOUS) IMPLANT
CANNULA SUMP PERICARDIAL (CANNULA) ×2 IMPLANT
CANNULA VEN 2 STAGE (MISCELLANEOUS)
CATH HEART VENT LEFT (CATHETERS) IMPLANT
CATH THORACIC 28FR (CATHETERS) IMPLANT
CATH THORACIC 36FR (CATHETERS) ×2 IMPLANT
CATH THORACIC 36FR RT ANG (CATHETERS) ×2 IMPLANT
CATH/SQUID NICHOLS JEHLE COR (CATHETERS) ×2 IMPLANT
CAUTERY EYE LOW TEMP 1300F FIN (OPHTHALMIC RELATED) ×4 IMPLANT
CLIP VESOCCLUDE MED 6/CT (CLIP) ×4 IMPLANT
CLIP VESOCCLUDE SM WIDE 24/CT (CLIP) ×6 IMPLANT
CONN 3/8X3/8 GISH STERILE (MISCELLANEOUS) IMPLANT
CONN Y 3/8X3/8X3/8  BEN (MISCELLANEOUS)
CONN Y 3/8X3/8X3/8 BEN (MISCELLANEOUS) IMPLANT
CONT SPEC 4OZ CLIKSEAL STRL BL (MISCELLANEOUS) ×6 IMPLANT
COUNTER NEEDLE 20 DBL MAG RED (NEEDLE) ×2 IMPLANT
CRADLE DONUT ADULT HEAD (MISCELLANEOUS) IMPLANT
CRYOLIFE CRYOPRESERVED SAPHENOUS VEIN ×2 IMPLANT
DRSG AQUACEL AG ADV 3.5X14 (GAUZE/BANDAGES/DRESSINGS) ×2 IMPLANT
DRSG COVADERM 4X10 (GAUZE/BANDAGES/DRESSINGS) ×2 IMPLANT
DRSG COVADERM 4X14 (GAUZE/BANDAGES/DRESSINGS) ×4 IMPLANT
ELECT REM PT RETURN 9FT ADLT (ELECTROSURGICAL) ×8
ELECTRODE REM PT RTRN 9FT ADLT (ELECTROSURGICAL) ×4 IMPLANT
FELT TEFLON 1X6 (MISCELLANEOUS) ×2 IMPLANT
FELT TEFLON 6X6 (MISCELLANEOUS) IMPLANT
GAUZE SPONGE 4X4 12PLY STRL (GAUZE/BANDAGES/DRESSINGS) ×8 IMPLANT
GAUZE SPONGE 4X4 12PLY STRL LF (GAUZE/BANDAGES/DRESSINGS) ×2 IMPLANT
GLOVE BIO SURGEON STRL SZ 6.5 (GLOVE) ×6 IMPLANT
GLOVE BIO SURGEONS STRL SZ 6.5 (GLOVE) ×6
GLOVE BIOGEL PI IND STRL 6 (GLOVE) IMPLANT
GLOVE BIOGEL PI IND STRL 6.5 (GLOVE) IMPLANT
GLOVE BIOGEL PI IND STRL 7.0 (GLOVE) IMPLANT
GLOVE BIOGEL PI INDICATOR 6 (GLOVE) ×2
GLOVE BIOGEL PI INDICATOR 6.5 (GLOVE) ×4
GLOVE BIOGEL PI INDICATOR 7.0 (GLOVE) ×2
GOWN STRL REUS W/ TWL LRG LVL3 (GOWN DISPOSABLE) ×8 IMPLANT
GOWN STRL REUS W/ TWL XL LVL3 (GOWN DISPOSABLE) IMPLANT
GOWN STRL REUS W/TWL LRG LVL3 (GOWN DISPOSABLE) ×32
GOWN STRL REUS W/TWL XL LVL3 (GOWN DISPOSABLE) ×4
GRAFT HEMASHIELD 24X30M (Vascular Products) ×2 IMPLANT
HANDLE STAPLE ENDO GIA SHORT (STAPLE) ×1
HEMOSTAT SURGICEL 2X14 (HEMOSTASIS) ×18 IMPLANT
INSERT FOGARTY SM (MISCELLANEOUS) ×10 IMPLANT
INSERT FOGARTY XLG (MISCELLANEOUS) ×2 IMPLANT
KIT BASIN OR (CUSTOM PROCEDURE TRAY) ×4 IMPLANT
KIT DRAINAGE VACCUM ASSIST (KITS) ×2 IMPLANT
KIT ROOM TURNOVER OR (KITS) ×4 IMPLANT
KIT SUCTION CATH 14FR (SUCTIONS) IMPLANT
LEAD PACING MYOCARDI (MISCELLANEOUS) ×4 IMPLANT
NEEDLE AORTIC AIR ASPIRATING (NEEDLE) IMPLANT
NS IRRIG 1000ML POUR BTL (IV SOLUTION) ×16 IMPLANT
PACK E OPEN HEART (SUTURE) ×4 IMPLANT
PACK OPEN HEART (CUSTOM PROCEDURE TRAY) ×4 IMPLANT
PAD ARMBOARD 7.5X6 YLW CONV (MISCELLANEOUS) ×8 IMPLANT
POWDER SURGICEL 3.0 GRAM (HEMOSTASIS) ×4 IMPLANT
PUNCH AORTIC ROT 4.0MM RCL 40 (MISCELLANEOUS) ×2 IMPLANT
RELOAD TRI 2.0 30 VAS MED SUL (STAPLE) ×2 IMPLANT
SEALANT SURG COSEAL 8ML (VASCULAR PRODUCTS) ×6 IMPLANT
SPONGE LAP 18X18 X RAY DECT (DISPOSABLE) ×12 IMPLANT
SPONGE LAP 4X18 X RAY DECT (DISPOSABLE) ×2 IMPLANT
STAPLER ENDO GIA 12 SHRT THIN (STAPLE) IMPLANT
STAPLER ENDO GIA 12MM SHORT (STAPLE) ×3 IMPLANT
STAPLER VISISTAT 35W (STAPLE) ×8 IMPLANT
STOPCOCK 4 WAY LG BORE MALE ST (IV SETS) ×2 IMPLANT
SUT ETHIBON 2 0 V 52N 30 (SUTURE) ×4 IMPLANT
SUT ETHIBOND 2 0 SH (SUTURE) ×4
SUT ETHIBOND 2 0 SH 36X2 (SUTURE) IMPLANT
SUT MNCRL AB 3-0 PS2 18 (SUTURE) IMPLANT
SUT PROLENE 3 0 RB 1 (SUTURE) ×4 IMPLANT
SUT PROLENE 3 0 SH DA (SUTURE) ×4 IMPLANT
SUT PROLENE 4 0 RB 1 (SUTURE) ×16
SUT PROLENE 4 0 SH DA (SUTURE) ×78 IMPLANT
SUT PROLENE 4-0 RB1 .5 CRCL 36 (SUTURE) IMPLANT
SUT PROLENE 5 0 C 1 24 (SUTURE) ×6 IMPLANT
SUT PROLENE 5 0 C 1 36 (SUTURE) ×10 IMPLANT
SUT PROLENE 6 0 C 1 30 (SUTURE) ×10 IMPLANT
SUT PROLENE 6 0 CC (SUTURE) IMPLANT
SUT PROLENE 7 0 BV 1 (SUTURE) IMPLANT
SUT PROLENE 7 0 BV1 MDA (SUTURE) ×2 IMPLANT
SUT SILK 1 TIES 10X30 (SUTURE) ×2 IMPLANT
SUT SILK 2 0 SH CR/8 (SUTURE) ×2 IMPLANT
SUT STEEL 6MS V (SUTURE) ×2 IMPLANT
SUT STEEL STERNAL CCS#1 18IN (SUTURE) IMPLANT
SUT STEEL SZ 6 DBL 3X14 BALL (SUTURE) ×2 IMPLANT
SUT VIC AB 1 CT1 18XCR BRD 8 (SUTURE) IMPLANT
SUT VIC AB 1 CT1 8-18 (SUTURE)
SUT VIC AB 1 CTX 27 (SUTURE) ×8 IMPLANT
SUT VIC AB 2-0 CT1 27 (SUTURE)
SUT VIC AB 2-0 CT1 TAPERPNT 27 (SUTURE) IMPLANT
SUT VIC AB 2-0 CTX 36 (SUTURE) ×8 IMPLANT
SUT VIC AB 3-0 SH 27 (SUTURE)
SUT VIC AB 3-0 SH 27X BRD (SUTURE) IMPLANT
SUT VIC AB 3-0 X1 27 (SUTURE) ×8 IMPLANT
SUT VICRYL 4-0 PS2 18IN ABS (SUTURE) IMPLANT
SYR 10ML KIT SKIN ADHESIVE (MISCELLANEOUS) ×4 IMPLANT
SYSTEM SAHARA CHEST DRAIN ATS (WOUND CARE) ×4 IMPLANT
TAPE CLOTH SURG 4X10 WHT LF (GAUZE/BANDAGES/DRESSINGS) ×2 IMPLANT
TAPE PAPER 2X10 WHT MICROPORE (GAUZE/BANDAGES/DRESSINGS) ×2 IMPLANT
TAPE UMBILICAL COTTON 1/8X30 (MISCELLANEOUS) ×2 IMPLANT
TOWEL OR 17X24 6PK STRL BLUE (TOWEL DISPOSABLE) ×2 IMPLANT
TOWEL OR 17X26 10 PK STRL BLUE (TOWEL DISPOSABLE) ×2 IMPLANT
TRAY CATH LUMEN 1 20CM STRL (SET/KITS/TRAYS/PACK) ×2 IMPLANT
TRAY FOLEY SILVER 14FR TEMP (SET/KITS/TRAYS/PACK) ×4 IMPLANT
TUBE CONNECTING 12'X1/4 (SUCTIONS) ×1
TUBE CONNECTING 12X1/4 (SUCTIONS) ×1 IMPLANT
TUBE CONNECTING 20'X1/4 (TUBING) ×1
TUBE CONNECTING 20X1/4 (TUBING) ×1 IMPLANT
TUBING ART PRESS 48 MALE/FEM (TUBING) ×4 IMPLANT
VALVE MAGNA EASE 21MM (Prosthesis & Implant Heart) ×2 IMPLANT
VENT LEFT HEART 12002 (CATHETERS) ×4
WATER STERILE IRR 1000ML POUR (IV SOLUTION) ×8 IMPLANT
YANKAUER SUCT BULB TIP NO VENT (SUCTIONS) ×4 IMPLANT

## 2017-03-21 LAB — TYPE AND SCREEN
ABO/RH(D): A POS
ANTIBODY SCREEN: NEGATIVE
UNIT DIVISION: 0
UNIT DIVISION: 0
UNIT DIVISION: 0
UNIT DIVISION: 0
UNIT DIVISION: 0
UNIT DIVISION: 0
UNIT DIVISION: 0
UNIT DIVISION: 0
UNIT DIVISION: 0
UNIT DIVISION: 0
Unit division: 0
Unit division: 0
Unit division: 0
Unit division: 0
Unit division: 0
Unit division: 0
Unit division: 0
Unit division: 0
Unit division: 0
Unit division: 0

## 2017-03-21 LAB — BPAM PLATELET PHERESIS
BLOOD PRODUCT EXPIRATION DATE: 201812102359
BLOOD PRODUCT EXPIRATION DATE: 201812102359
Blood Product Expiration Date: 201812102359
ISSUE DATE / TIME: 201812101638
ISSUE DATE / TIME: 201812101638
ISSUE DATE / TIME: 201812101700
UNIT TYPE AND RH: 5100
UNIT TYPE AND RH: 6200
UNIT TYPE AND RH: 7300

## 2017-03-21 LAB — BPAM RBC
BLOOD PRODUCT EXPIRATION DATE: 201812262359
BLOOD PRODUCT EXPIRATION DATE: 201812272359
BLOOD PRODUCT EXPIRATION DATE: 201812282359
BLOOD PRODUCT EXPIRATION DATE: 201812282359
BLOOD PRODUCT EXPIRATION DATE: 201901012359
BLOOD PRODUCT EXPIRATION DATE: 201901012359
BLOOD PRODUCT EXPIRATION DATE: 201901012359
BLOOD PRODUCT EXPIRATION DATE: 201901012359
BLOOD PRODUCT EXPIRATION DATE: 201901012359
Blood Product Expiration Date: 201812262359
Blood Product Expiration Date: 201812272359
Blood Product Expiration Date: 201812282359
Blood Product Expiration Date: 201812292359
Blood Product Expiration Date: 201901012359
Blood Product Expiration Date: 201901012359
Blood Product Expiration Date: 201901012359
Blood Product Expiration Date: 201901012359
Blood Product Expiration Date: 201901012359
Blood Product Expiration Date: 201901012359
Blood Product Expiration Date: 201901012359
ISSUE DATE / TIME: 201812101246
ISSUE DATE / TIME: 201812101403
ISSUE DATE / TIME: 201812101403
ISSUE DATE / TIME: 201812101702
ISSUE DATE / TIME: 201812101702
ISSUE DATE / TIME: 201812102022
ISSUE DATE / TIME: 201812102022
ISSUE DATE / TIME: 201812102022
ISSUE DATE / TIME: 201812101246
ISSUE DATE / TIME: 201812101246
ISSUE DATE / TIME: 201812101246
ISSUE DATE / TIME: 201812101403
ISSUE DATE / TIME: 201812101403
ISSUE DATE / TIME: 201812101702
ISSUE DATE / TIME: 201812101702
ISSUE DATE / TIME: 201812102022
UNIT TYPE AND RH: 6200
UNIT TYPE AND RH: 6200
UNIT TYPE AND RH: 6200
UNIT TYPE AND RH: 6200
UNIT TYPE AND RH: 6200
UNIT TYPE AND RH: 6200
UNIT TYPE AND RH: 6200
UNIT TYPE AND RH: 6200
Unit Type and Rh: 6200
Unit Type and Rh: 6200
Unit Type and Rh: 6200
Unit Type and Rh: 6200
Unit Type and Rh: 6200
Unit Type and Rh: 6200
Unit Type and Rh: 6200
Unit Type and Rh: 6200
Unit Type and Rh: 6200
Unit Type and Rh: 6200
Unit Type and Rh: 6200
Unit Type and Rh: 6200

## 2017-03-21 LAB — BPAM CRYOPRECIPITATE
Blood Product Expiration Date: 201812102240
ISSUE DATE / TIME: 201812101700
Unit Type and Rh: 6200

## 2017-03-21 LAB — PREPARE PLATELET PHERESIS
UNIT DIVISION: 0
UNIT DIVISION: 0
Unit division: 0

## 2017-03-21 LAB — BPAM FFP
BLOOD PRODUCT EXPIRATION DATE: 201812112359
BLOOD PRODUCT EXPIRATION DATE: 201812142359
Blood Product Expiration Date: 201812142359
Blood Product Expiration Date: 201812152359
ISSUE DATE / TIME: 201812101637
ISSUE DATE / TIME: 201812101637
ISSUE DATE / TIME: 201812101637
ISSUE DATE / TIME: 201812101637
UNIT TYPE AND RH: 6200
UNIT TYPE AND RH: 6200
UNIT TYPE AND RH: 6200
Unit Type and Rh: 6200

## 2017-03-21 LAB — PREPARE FRESH FROZEN PLASMA
UNIT DIVISION: 0
UNIT DIVISION: 0
Unit division: 0
Unit division: 0

## 2017-03-21 LAB — PREPARE CRYOPRECIPITATE: UNIT DIVISION: 0

## 2017-03-22 NOTE — Addendum Note (Signed)
Addendum  created 03/22/17 1212 by Oletta Lamas, CRNA   Intraprocedure Flowsheets edited, Intraprocedure Meds edited

## 2017-03-27 ENCOUNTER — Encounter (HOSPITAL_COMMUNITY): Payer: Self-pay | Admitting: Thoracic Surgery (Cardiothoracic Vascular Surgery)

## 2017-03-31 ENCOUNTER — Other Ambulatory Visit: Payer: Self-pay | Admitting: Nurse Practitioner

## 2017-04-11 NOTE — Anesthesia Procedure Notes (Signed)
Procedure Name: Intubation Date/Time: 2017-03-21 12:21 PM Performed by: Teressa Lower., CRNA Pre-anesthesia Checklist: Patient identified, Emergency Drugs available, Suction available, Patient being monitored and Timeout performed Patient Re-evaluated:Patient Re-evaluated prior to induction Oxygen Delivery Method: Circle system utilized Preoxygenation: Pre-oxygenation with 100% oxygen Induction Type: IV induction Ventilation: Mask ventilation without difficulty Laryngoscope Size: Mac and 3 Grade View: Grade I Tube type: Subglottic suction tube Tube size: 8.0 mm Number of attempts: 1 Airway Equipment and Method: Stylet Placement Confirmation: ETT inserted through vocal cords under direct vision,  positive ETCO2,  CO2 detector and breath sounds checked- equal and bilateral Secured at: 21 cm Tube secured with: Tape Dental Injury: Teeth and Oropharynx as per pre-operative assessment

## 2017-04-11 NOTE — Progress Notes (Signed)
  Echocardiogram Echocardiogram Transesophageal has been performed.  Kelsey Fuller March 23, 2017, 1:45 PM

## 2017-04-11 NOTE — Brief Op Note (Signed)
2017-03-27  10:04 PM  PATIENT:  Kelsey Fuller  77 y.o. female  PRE-OPERATIVE DIAGNOSIS:  Type A aortic dissection  POST-OPERATIVE DIAGNOSIS:  Type A aortic dissection  PROCEDURE:  EMERGENT REPAIR of TYPE A AORTIC DISSECTION (using Hemashield Gold 52mm), AORTIC VALVE REPLACEMENT (AVR) (using a Magna Ease Aortic Valve, size 28mm), CORONARY ARTERY BYPASS GRAFTING (CABG) x 1 (FREE LIMA and portion of GREATER SAPHENOUS VEIN GRAFT to LAD) using OPEN HARVEST of BILATERAL SAPHENOUS VEIN GRAFT from both GROINS and OPEN HARVEST of LIMA.  SURGEON:  Surgeon(s) and Role:    1. Melrose Nakayama, MD - Primary    2. Grace Isaac, MD - Assisting  PHYSICIAN ASSISTANT: Lars Pinks PA-C  ASSISTANTS: Sharee Pimple RNFA  ANESTHESIA:   general  BLOOD ADMINISTERED:12 CC PRBC, 7 CC CELLSAVER, numerous FFP and numerous PLTS  DRAINS: none   SPECIMEN:  Source of Specimen:  Portion of thoracic aortic aneursym/dissection  DISPOSITION OF SPECIMEN:  PATHOLOGY  COUNTS CORRECT:  YES  DICTATION: .Dragon Dictation  PLAN OF CARE: Patient expired in OR

## 2017-04-11 NOTE — ED Notes (Signed)
Delay in lab draw pt not in room. 

## 2017-04-11 NOTE — Anesthesia Procedure Notes (Signed)
Central Venous Catheter Insertion Performed by: Catalina Gravel, MD, anesthesiologist Start/End12-20-18 12:25 PM, 03/30/2017 12:35 PM Patient location: Pre-op. Preanesthetic checklist: patient identified, IV checked, site marked, risks and benefits discussed, surgical consent, monitors and equipment checked, pre-op evaluation, timeout performed and anesthesia consent Position: Trendelenburg Lidocaine 1% used for infiltration and patient sedated Hand hygiene performed , maximum sterile barriers used  and Seldinger technique used Catheter size: 9 Fr Total catheter length 10. Central line was placed.MAC introducer Swan type:thermodilution PA Cath depth:50 Procedure performed using ultrasound guided technique. Ultrasound Notes:anatomy identified, needle tip was noted to be adjacent to the nerve/plexus identified, no ultrasound evidence of intravascular and/or intraneural injection and image(s) printed for medical record Attempts: 1 Following insertion, line sutured and dressing applied. Post procedure assessment: blood return through all ports, free fluid flow and no air  Patient tolerated the procedure well with no immediate complications.

## 2017-04-11 NOTE — ED Notes (Addendum)
Pt had 2 loose stools - states she feels better after. Appears pale and is nauseated. Waiting for CMP results for Ct Scan

## 2017-04-11 NOTE — Anesthesia Procedure Notes (Signed)
Central Venous Catheter Insertion Performed by: Catalina Gravel, MD, anesthesiologist Start/End04-Jan-2019 12:35 PM, 04/14/2017 12:40 PM Patient location: Pre-op. Preanesthetic checklist: patient identified, IV checked, site marked, risks and benefits discussed, surgical consent, monitors and equipment checked, pre-op evaluation, timeout performed and anesthesia consent Hand hygiene performed  and maximum sterile barriers used  Total catheter length 48. PA cath was placed.Swan type:thermodilution PA Cath depth:100 Procedure performed without using ultrasound guided technique. Attempts: 1 Patient tolerated the procedure well with no immediate complications.

## 2017-04-11 NOTE — Progress Notes (Signed)
Inpatient Diabetes Program Recommendations  AACE/ADA: New Consensus Statement on Inpatient Glycemic Control (2015)  Target Ranges:  Prepandial:   less than 140 mg/dL      Peak postprandial:   less than 180 mg/dL (1-2 hours)      Critically ill patients:  140 - 180 mg/dL   Lab Results  Component Value Date   GLUCAP 273 (H) 2017/03/31   Review of Glycemic Control  Diabetes history: None Current orders for Inpatient glycemic control: IV insulin, currently in OR  Inpatient Diabetes Program Recommendations:    Lab glucose 273 mg/dl. Family history of DM on Maternal side. Consider A1c level.  Thanks,  Tama Headings RN, MSN, [facility] Inpatient Diabetes Coordinator Team Pager [phone] (8a-5p)

## 2017-04-11 NOTE — H&P (Signed)
Kelsey Fuller is an 77 y.o. female.   Chief Complaint: pain HPI: 77 yo woman with history of breast cancer, arthritis, COPD, osteoporosis, vein stripping and glaucoma. No prior cardiac history. This morning she developed pain from the top of her head down to her abdomen, light headed, generalized weakness and nausea. No LOC. Came to ED. Hypotensive on arrival. CT of head chest and abdomen showed an ascending aortic intramural hematoma with possible leak just above the valve. + hemopericardium. No great vessel or arch involvement.  Currently feels better than when she 1st arrived.  No significant cardiac history. Has seen Dr. Acie Fredrickson in past. Echo in 2014- mild AI and MR, good LV systolic function. Myoview negative for ischemia.  Past Medical History:  Diagnosis Date  . Allergy   . Arthritis   . Asthma    (pt denies)  . 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 Breast cancer of upper-inner quadrant of right female breast ([place]) 08/07/2015  . Chronic headaches    (resolved)  . Complication of anesthesia   . COPD (chronic obstructive pulmonary disease) ([place])   . Glaucoma   . Leg pain   . Osteoporosis   . PONV (postoperative nausea and vomiting)   . Shortness of breath on exertion   . Thrombophlebitis     Past Surgical History:  Procedure Laterality Date  . APPENDECTOMY    . BREAST LUMPECTOMY WITH RADIOACTIVE SEED LOCALIZATION Right 09/01/2015   Procedure: RADIOACTIVE SEED GUIDED RIGHT BREAST LUMPECTOMY;  Surgeon: Stark Klein, MD;  Location: [place];  Service: General;  Laterality: Right;  . BREAST SURGERY    . TONSILLECTOMY    . VARICOSE VEIN SURGERY      Family History  Problem Relation Age of Onset  . Diabetes Mother   . Heart disease Mother        congestive heart failure  . Hypertension Mother   . Dementia Mother   . Heart disease Father        rheumatic heart disease  . Deep vein thrombosis Son   . Deep vein thrombosis Maternal  Grandfather   . Pulmonary embolism Son        after a boating accident, long drive; had DVT/PE  . Deep vein thrombosis Cousin   . Cancer Neg Hx    Social History:  reports that she quit smoking about 41 years ago. Her smoking use included cigarettes. She has a 10.00 pack-year smoking history. she has never used smokeless tobacco. She reports that she drinks about 0.6 oz of alcohol per week. She reports that she does not use drugs.  Allergies:  Allergies  Allergen Reactions  . Codeine Other (See Comments)  . Penicillins Other (See Comments)  . Sulfa Antibiotics Other (See Comments)    Told as a child by her mother never to take  . Fish Allergy Diarrhea    Scaled fish- when pregnant ate some recently no problem     (Not in a hospital admission)  Results for orders placed or performed during the hospital encounter of 23-Mar-2017 (from the past 48 hour(s))  CBC with Differential/Platelet     Status: Abnormal   Collection Time: 03-23-2017  8:21 AM  Result Value Ref Range   WBC 10.9 (H) 4.0 - 10.5 K/uL   RBC 4.58 3.87 - 5.11 MIL/uL   Hemoglobin 13.7 12.0 - 15.0 g/dL   HCT 42.3 36.0 - 46.0 %  MCV 92.4 78.0 - 100.0 fL   MCH 29.9 26.0 - 34.0 pg   MCHC 32.4 30.0 - 36.0 g/dL   RDW 14.3 11.5 - 15.5 %   Platelets 179 150 - 400 K/uL   Neutrophils Relative % 64 %   Neutro Abs 6.9 1.7 - 7.7 K/uL   Lymphocytes Relative 27 %   Lymphs Abs 3.0 0.7 - 4.0 K/uL   Monocytes Relative 5 %   Monocytes Absolute 0.6 0.1 - 1.0 K/uL   Eosinophils Relative 4 %   Eosinophils Absolute 0.4 0.0 - 0.7 K/uL   Basophils Relative 0 %   Basophils Absolute 0.0 0.0 - 0.1 K/uL  Comprehensive metabolic panel     Status: Abnormal   Collection Time: 04-10-2017  8:21 AM  Result Value Ref Range   Sodium 137 135 - 145 mmol/L   Potassium 4.1 3.5 - 5.1 mmol/L   Chloride 110 101 - 111 mmol/L   CO2 14 (L) 22 - 32 mmol/L   Glucose, Bld 294 (H) 65 - 99 mg/dL   BUN 19 6 - 20 mg/dL   Creatinine, Ser 1.11 (H) 0.44 - 1.00 mg/dL    Calcium 8.1 (L) 8.9 - 10.3 mg/dL   Total Protein 5.8 (L) 6.5 - 8.1 g/dL   Albumin 3.1 (L) 3.5 - 5.0 g/dL   AST 106 (H) 15 - 41 U/L   ALT 65 (H) 14 - 54 U/L   Alkaline Phosphatase 84 38 - 126 U/L   Total Bilirubin 0.7 0.3 - 1.2 mg/dL   GFR calc non Af Amer 47 (L) >60 mL/min   GFR calc Af Amer 54 (L) >60 mL/min    Comment: (NOTE) The eGFR has been calculated using the CKD EPI equation. This calculation has not been validated in all clinical situations. eGFR's persistently <60 mL/min signify possible Chronic Kidney Disease.    Anion gap 13 5 - 15  Troponin I     Status: None   Collection Time: 04/10/2017  8:21 AM  Result Value Ref Range   Troponin I <0.03 <0.03 ng/mL  Lipase, blood     Status: None   Collection Time: 10-Apr-2017  8:21 AM  Result Value Ref Range   Lipase 37 11 - 51 U/L  CBG monitoring, ED     Status: Abnormal   Collection Time: 04/10/17  8:51 AM  Result Value Ref Range   Glucose-Capillary 273 (H) 65 - 99 mg/dL   Comment 1 Notify RN    Comment 2 Document in Chart   I-Stat CG4 Lactic Acid, ED     Status: Abnormal   Collection Time: Apr 10, 2017 11:01 AM  Result Value Ref Range   Lactic Acid, Venous 4.29 (HH) 0.5 - 1.9 mmol/L   Comment NOTIFIED PHYSICIAN   Type and screen [place]     Status: None (Preliminary result)   Collection Time: Apr 10, 2017 11:04 AM  Result Value Ref Range   ABO/RH(D) A POS    Antibody Screen PENDING    Sample Expiration 03/23/2017    Ct Head Wo Contrast  Result Date: 04/10/2017 CLINICAL DATA:  Headache, nausea, weakness.  Breast cancer. EXAM: CT HEAD WITHOUT CONTRAST TECHNIQUE: Contiguous axial images were obtained from the base of the skull through the vertex without intravenous contrast. COMPARISON:  None. FINDINGS: Brain: No evidence of acute infarction, hemorrhage, hydrocephalus, extra-axial collection or mass lesion/mass effect. Subcortical white matter and periventricular small vessel ischemic changes. Vascular: No  hyperdense vessel or unexpected calcification. Skull: Normal. Negative  for fracture or focal lesion. Sinuses/Orbits: The visualized paranasal sinuses are essentially clear. The mastoid air cells are unopacified. Other: None. IMPRESSION: No evidence of acute intracranial abnormality. Small vessel ischemic changes. Electronically Signed   By: Julian Hy M.D.   On: 2017/04/18 10:44   Dg Chest Port 1 View  Result Date: 18-Apr-2017 CLINICAL DATA:  Long-term smoker. History of COPD, breast malignancy, shortness of breath today. EXAM: PORTABLE CHEST 1 VIEW COMPARISON:  PA and lateral chest x-ray of May 21, 2007 FINDINGS: The lungs are adequately inflated and clear. The cardiac silhouette is mildly enlarged. The pulmonary vascularity is normal. The mediastinum is normal in width. There is calcification in the wall of the thoracic aorta. There are surgical clips in the right infrahilar region likely from previous breast surgery. IMPRESSION: Mild enlargement of cardiac silhouette without pulmonary vascular congestion or pulmonary edema. No acute pneumonia. Thoracic aortic atherosclerosis. Electronically Signed   By: David  Martinique M.D.   On: 04/18/17 08:49   Ct Angio Chest/abd/pel For Dissection W And/or Wo Contrast  Result Date: 04-18-2017 CLINICAL DATA:  Short of breath. EXAM: CT ANGIOGRAPHY CHEST, ABDOMEN AND PELVIS TECHNIQUE: Multidetector CT imaging through the chest, abdomen and pelvis was performed using the standard protocol during bolus administration of intravenous contrast. Multiplanar reconstructed images and MIPs were obtained and reviewed to evaluate the vascular anatomy. CONTRAST:  124m ISOVUE-370 IOPAMIDOL (ISOVUE-370) INJECTION 76% COMPARISON:  None. FINDINGS: CTA CHEST FINDINGS Cardiovascular: A Stanford type A ascending aortic dissection is present. The false lumen is largely thrombosed consistent with intramural hematoma. There is a small amount of contrast opacification in the false  lumen posterior to the ascending aorta. See image 74 of series 6. The adjacent contrast slightly posterior and to the left is the left atrial appendage. The thrombosed intramural hematoma is somewhat hyperdense on noncontrast images. Overall diameter of the ascending aorta including the thrombosed false lumen measures 5.9 cm. The descending thoracic aorta is not affected by the dissection. Scattered atherosclerotic calcification and plaque is noted in the aortic arch and descending thoracic aorta. There is also hemopericardium present. The pericardial effusion is small and somewhat hyperdense. The enlarged aorta and pericardial blood does exert some mass effect upon the left atrium likely limiting filling. The great vessels are also unaffected by the dissection there is no evidence of dissection in the great vessels nor is there any evidence of extension of the intramural hematoma. A subtle intramural hematoma in the great vessels cannot entirely be excluded. Vertebral arteries opacified with contrast bilaterally. All 3 coronary arteries opacified with contrast. There is no obvious acute pulmonary thromboembolism. Mediastinum/Nodes: No obviously enlarged lymph nodes are present. Small calcification in the left thyroid without obvious nodule is nonspecific. There is stranding around the great vessels which may represent a small amount of mediastinal hemorrhage. Peribronchovascular soft tissue thickening in the hilar regions is nonspecific. Lungs/Pleura: No pneumothorax. Advanced emphysema. 10 mm irregular and slightly spiculated opacity at the right apex on image 24 of series 7. Scattered linear opacities and ground-glass opacities in the lungs likely reflect volume loss. No pneumothorax. No pleural effusion. Musculoskeletal: Mild wedging of T8 has a chronic appearance. No definite acute rib fracture. No obvious destructive bone lesion in the thorax. Review of the MIP images confirms the above findings. CTA ABDOMEN  AND PELVIS FINDINGS VASCULAR Aorta: There is no evidence of aortic dissection in the abdomen. The aorta is nonaneurysmal and patent with scattered atherosclerotic calcifications and plaque. There is no obvious intramural hematoma  in the abdominal aorta. 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 Branch vessels are grossly patent. Accessory left hepatic artery anatomy. SMA: Patent. Renals: Single renal arteries are patent bilaterally. IMA: Severely diminutive but grossly patent. There is no evidence of dissection. Common, internal, and external iliac arteries are nonaneurysmal and patent with scattered atherosclerotic calcification and plaque. Inflow: Visualized femoral vessels are grossly patent and without evidence of dissection. Review of the MIP images confirms the above findings. NON-VASCULAR Hepatobiliary: Liver is unremarkable. Gallbladder is very diminutive and decompressed. Pancreas: Unremarkable Spleen: Unremarkable Adrenals/Urinary Tract: There is scattered scarring in the kidneys bilaterally. There is a calculus in the lower pole of the right kidney measuring 5 mm. 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 Stomach/Bowel: Stomach and duodenum are grossly within normal limits. There is no evidence of small-bowel obstruction. No obvious mass in the colon. Prominent stool burden in the distal sigmoid and rectum. Lymphatic: No abnormal retroperitoneal adenopathy. Reproductive: Uterus and adnexa are within normal limits. Other: No free-fluid. Musculoskeletal: No vertebral compression deformity. Lumbosacral junction is transitional. There is severe disc space narrowing at L5-S1. The S1 vertebral body is lumbarized. Scattered degenerative disc disease in the lumbar spine is present. Review of the MIP images confirms the above findings. IMPRESSION: Vascular: A Stanford type a dissection is present characterized by and intramural hematoma in the ascending aorta. There is a small area of  contrast opacification in the false lumen, posterior to the proximal ascending aorta. The remainder of the false lumen in the ascending aorta is thrombosed and hyperdense. There is no evidence of extension of the dissection into the descending aorta or great vessels. There is an associated hemopericardium. Critical Value/emergent results were called by telephone at the time of interpretation on 13-Apr-2017 at 11:04 am to Dr. Pattricia Boss , who verbally acknowledged these results. Nonvascular: 10 mm spiculated lung nodule at the right lung apex. There are no prior studies available for comparison. Malignancy is not excluded. PET-CT may be helpful. Right nephrolithiasis. Electronically Signed   By: Marybelle Killings M.D.   On: 04-13-17 11:05    Review of Systems  Constitutional: Negative for chills and fever.  Cardiovascular: Positive for chest pain.  Gastrointestinal: Positive for nausea.  Musculoskeletal: Positive for joint pain.  Neurological: Positive for dizziness and weakness. Negative for focal weakness and loss of consciousness.    Blood pressure 107/86, pulse (!) 105, temperature (!) 97.4 F (36.3 C), temperature source Oral, resp. rate (!) 23, height '5\' 3"'$  (1.6 m), weight 105 lb (47.6 kg), SpO2 98 %. Physical Exam  Vitals reviewed. Constitutional: She is oriented to person, place, and time. No distress.  Elderly, frail appearing  HENT:  Head: Normocephalic and atraumatic.  Mouth/Throat: No oropharyngeal exudate.  Eyes: Conjunctivae and EOM are normal. No scleral icterus.  Neck: Neck supple. No thyromegaly present.  Cardiovascular: Normal heart sounds. Exam reveals no gallop and no friction rub.  No murmur heard. Pulses:      Carotid pulses are 2+ on the right side, and 2+ on the left side.      Radial pulses are 1+ on the right side, and 1+ on the left side.       Femoral pulses are 1+ on the right side, and 1+ on the left side.      Dorsalis pedis pulses are 0 on the right side, and  0 on the left side.       Posterior tibial pulses are 0 on the right side, and  0 on the left side.  Tachy,regular  Respiratory: Effort normal and breath sounds normal. No respiratory distress. She has no wheezes.  GI: Soft. She exhibits no distension. There is no tenderness.  Musculoskeletal: She exhibits no edema.  Lymphadenopathy:    She has no cervical adenopathy.  Neurological: She is alert and oriented to person, place, and time. No cranial nerve deficit. She exhibits normal muscle tone.  Skin: Skin is warm and dry.     Assessment/Plan 77 yo woman who presented with pain originating in top of head and radiating all the way down to her abdomen. Was found to have an intramural hematoma of the ascending aorta with hemopericardium. She is currently relatively stable but needs emergent surgery to replace the ascending aorta. May require AVR as well- decision will have to made intraoperatively.   I have discussed the general nature of the procedure, the need for general anesthesia, and the incisions to be used with Mrs Vos and her family. We discussed the expected hospital stay, overall recovery and short and long term outcomes. I informed her of the indications, risks, benefits and alternatives. They understand the risks include, but are not limited to death, stroke, MI, DVT/PE, bleeding, need for transfusion, infections, as well as other organ system dysfunction including respiratory, renal, or GI complications. She accepts the risks and agrees to proceed.  OR has been notified. Will proceed as soon as a room is available.  Melrose Nakayama, MD 2017/03/25, 11:35 AM

## 2017-04-11 NOTE — ED Triage Notes (Signed)
Pt from home with sudden on set of nausea and weakness after morning coffee. Pt is currently being treated from breast cancer.

## 2017-04-11 NOTE — Anesthesia Postprocedure Evaluation (Signed)
Anesthesia Post Note  Patient: Kelsey Fuller  Procedure(s) Performed: THORACIC ASCENDING ANEURYSM REPAIR (AAA) using Hemashield Gold 77mm (N/A ) AORTIC VALVE REPLACEMENT (AVR) with Magna Ease Aortic Valve size 28mm (N/A Chest) CORONARY ARTERY BYPASS GRAFTING (CABG) (N/A Chest)     Anesthesia Type: General Post-procedure mental status: Patient expired in the OR.    Last Vitals:  Vitals:   04-19-2017 1115 04-19-17 1145  BP: 107/86   Pulse: (!) 105   Resp: (!) 23 (!) 23  Temp:    SpO2: 98%     Last Pain:  Vitals:   04/19/17 0825  TempSrc: Oral  PainSc:                  Khristine Verno,W. EDMOND

## 2017-04-11 NOTE — Anesthesia Preprocedure Evaluation (Signed)
Anesthesia Evaluation  Patient identified by MRN, date of birth, ID band Patient awake    Reviewed: Allergy & Precautions, NPO status , Patient's Chart, lab work & pertinent test resultsPreop documentation limited or incomplete due to emergent nature of procedure.  History of Anesthesia Complications (+) PONV and history of anesthetic complications  Airway Mallampati: II  TM Distance: >3 FB Neck ROM: Full    Dental  (+) Teeth Intact, Dental Advisory Given   Pulmonary asthma , COPD, former smoker,    Pulmonary exam normal breath sounds clear to auscultation       Cardiovascular + Peripheral Vascular Disease (intramural hematoma of the ascending aorta with hemopericardium)   Rhythm:Regular Rate:Tachycardia  Echo in 2014- mild AI and MR, good LV systolic function   Neuro/Psych  Headaches, negative psych ROS   GI/Hepatic negative GI ROS, Neg liver ROS,   Endo/Other  negative endocrine ROS  Renal/GU Renal InsufficiencyRenal disease     Musculoskeletal negative musculoskeletal ROS (+)   Abdominal   Peds  Hematology negative hematology ROS (+)   Anesthesia Other Findings Day of surgery medications reviewed with the patient.  Reproductive/Obstetrics                             Anesthesia Physical Anesthesia Plan  ASA: V and emergent  Anesthesia Plan: General   Post-op Pain Management:    Induction: Intravenous  PONV Risk Score and Plan: 4 or greater and Propofol infusion and Treatment may vary due to age or medical condition  Airway Management Planned: Oral ETT  Additional Equipment: Arterial line, CVP, PA Cath, TEE and Ultrasound Guidance Line Placement  Intra-op Plan:   Post-operative Plan: Post-operative intubation/ventilation  Informed Consent: I have reviewed the patients History and Physical, chart, labs and discussed the procedure including the risks, benefits and alternatives  for the proposed anesthesia with the patient or authorized representative who has indicated his/her understanding and acceptance.   Only emergency history available  Plan Discussed with: CRNA  Anesthesia Plan Comments:         Anesthesia Quick Evaluation

## 2017-04-11 NOTE — Transfer of Care (Signed)
Immediate Anesthesia Transfer of Care Note  Patient: Kelsey Fuller  Procedure(s) Performed: THORACIC ASCENDING ANEURYSM REPAIR (AAA) using Hemashield Gold 40mm (N/A ) AORTIC VALVE REPLACEMENT (AVR) with Magna Ease Aortic Valve size 54mm (N/A Chest) CORONARY ARTERY BYPASS GRAFTING (CABG) (N/A Chest)  Patient Location: PACU  Anesthesia Type:General  Patient prepared for viewing prior to discharge from the OR.    Last Vitals:  Vitals:   03/22/2017 1115 2017/03/22 1145  BP: 107/86   Pulse: (!) 105   Resp: (!) 23 (!) 23  Temp:    SpO2: 98%     Last Pain:  Vitals:   2017-03-22 0825  TempSrc: Oral  PainSc:          Complications: death

## 2017-04-11 NOTE — ED Notes (Signed)
Pt CBG 273, notified Melissa [employer] banker, corporate).

## 2017-04-11 NOTE — ED Notes (Signed)
Cardiovascular surgeon in dept reviewing pt chart. Family aware he will be in for consult.

## 2017-04-11 NOTE — Anesthesia Procedure Notes (Signed)
Arterial Line Insertion Start/End2018/12/25 12:15 PM, 04-04-2017 12:20 PM Performed by: Catalina Gravel, MD, anesthesiologist  Patient location: Pre-op. Preanesthetic checklist: patient identified, IV checked, site marked, risks and benefits discussed, surgical consent, monitors and equipment checked, pre-op evaluation, timeout performed and anesthesia consent Lidocaine 1% used for infiltration radial was placed Catheter size: 20 Fr Hand hygiene performed  and maximum sterile barriers used   Attempts: 1 Procedure performed without using ultrasound guided technique. Following insertion, dressing applied. Post procedure assessment: normal and unchanged  Patient tolerated the procedure well with no immediate complications.

## 2017-04-11 NOTE — ED Notes (Signed)
Delay in lab draw pt not in room at this time,

## 2017-04-11 NOTE — ED Provider Notes (Addendum)
[facility] EMERGENCY DEPARTMENT Provider Note   CSN: [id number] Arrival date & time: March 30, 2017  0759     History   Chief Complaint Chief Complaint  Patient presents with  . Weakness  . Nausea    HPI Kelsey Fuller is a 77 y.o. female.  HPI  77 y.o. Female ho copd, breast cancer c.o. Headache, chest pain, chest, abdominal pain, epigastric pain, that began after drinking coffee this am.  She reports being in her usual state of health.   After having her am coffee, she began having pain in the back of her head radiating down through the epigastrium.  She became nauseated but did not vomit.  The pain in her head is now 3/10.  She has had no intervention.  EMS reports heart rate 140s.    Past Medical History:  Diagnosis Date  . Allergy   . Arthritis   . Asthma    (pt denies)  . 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 Chronic headaches    (resolved)  . Complication of anesthesia   . COPD (chronic obstructive pulmonary disease) ([place])   . Glaucoma   . Leg pain   . Osteoporosis   . PONV (postoperative nausea and vomiting)   . Shortness of breath on exertion   . Thrombophlebitis     Patient Active Problem List   Diagnosis Date Noted  . 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 Sciatica of right side 08/05/2015  . Osteopenia 10/30/2014  . COPD (chronic obstructive pulmonary disease) ([place]) 07/19/2012  . Family history of cardiomyopathy 07/19/2012    Past Surgical History:  Procedure Laterality Date  . APPENDECTOMY    . BREAST LUMPECTOMY WITH RADIOACTIVE SEED LOCALIZATION Right 09/01/2015   Procedure: RADIOACTIVE SEED GUIDED RIGHT BREAST LUMPECTOMY;  Surgeon: Stark Klein, MD;  Location: [facility];  Service: General;  Laterality: Right;  . BREAST SURGERY    . TONSILLECTOMY    . VARICOSE VEIN  SURGERY      OB History    Gravida Para Term Preterm AB Living   4       1 3    SAB TAB Ectopic Multiple Live Births   1               Home Medications    Prior to Admission medications   Medication Sig Start Date End Date Taking? Authorizing Provider  acetaminophen (TYLENOL) 325 MG tablet Take 650 mg by mouth every 6 (six) hours as needed. Reported on 08/20/2015    [provider]  anastrozole (ARIMIDEX) 1 MG tablet TAKE 1 TABLET DAILY 11/07/16   [name], MD  aspirin 81 MG tablet Take 81 mg by mouth daily.    [provider]  cholecalciferol (VITAMIN D) 1000 units tablet Take 1,000 Units by mouth daily.    [provider]  Colloidal Oatmeal (GOLD BOND ECZEMA RELIEF) 2 % CREA Apply 1 application topically as needed.    [provider]  denosumab (PROLIA) 60 MG/ML SOLN injection Inject 60 mg into the skin every 6 (six) months. Administer in upper arm, thigh, or abdomen    [provider]  fluticasone (FLOVENT HFA) 110 MCG/ACT inhaler Inhale 2 puffs into the lungs 2 (two) times daily. Reported on 10/02/2015 06/09/16   Rita Ohara, MD  ibuprofen (  ADVIL,MOTRIN) 200 MG tablet Take 600 mg by mouth every 8 (eight) hours as needed for moderate pain.    [provider]  latanoprost (XALATAN) 0.005 % ophthalmic solution Place 1 drop into both eyes at bedtime.  07/11/14   [provider]  Multiple Vitamins-Minerals (ALIVE WOMENS 50+ PO) Take 1 tablet by mouth daily.    [provider]  nitrofurantoin, macrocrystal-monohydrate, (MACROBID) 100 MG capsule Take 1 capsule (100 mg total) by mouth 2 (two) times daily. 10/25/16   Rita Ohara, MD  PREMARIN vaginal cream Place 1 Applicatorful vaginally daily.  01/06/17   [provider]  tiotropium (SPIRIVA HANDIHALER) 18 MCG inhalation capsule PLACE 1 CAPSULE INTO INHALER AND INHALE THE CONTENTS DAILY 06/09/16   Rita Ohara, MD  umeclidinium-vilanterol [facility] ELLIPTA) 62.5-25 MCG/INH  AEPB Inhale 1 puff into the lungs daily. (start this AFTER YOU USE UP [place]) 01/09/17   Rita Ohara, MD    Family History Family History  Problem Relation Age of Onset  . Diabetes Mother   . Heart disease Mother        congestive heart failure  . Hypertension Mother   . Dementia Mother   . Heart disease Father        rheumatic heart disease  . Deep vein thrombosis Son   . Deep vein thrombosis Maternal Grandfather   . Pulmonary embolism Son        after a boating accident, long drive; had DVT/PE  . Deep vein thrombosis Cousin   . Cancer Neg Hx     Social History Social History   Tobacco Use  . Smoking status: Former Smoker    Packs/day: 0.50    Years: 20.00    Pack years: 10.00    Types: Cigarettes    Last attempt to quit: 04/12/1975    Years since quitting: 41.9  . Smokeless tobacco: Never Used  . Tobacco comment: passive tobacco exposure as well, from husband  Substance Use Topics  . Alcohol use: Yes    Alcohol/week: 0.6 oz    Types: 1 Standard drinks or equivalent per week    Comment: 1 glass of wine (very small glass) 3x/week  . Drug use: No     Allergies   Codeine; Penicillins; Sulfa antibiotics; and Fish allergy   Review of Systems Review of Systems  All other systems reviewed and are negative.    Physical Exam Updated Vital Signs Pulse (!) 117   Temp (!) 97.4 F (36.3 C) (Oral)   Resp 14   Ht 1.6 m (5\' 3" )   Wt 47.6 kg (105 lb)   SpO2 97%   BMI 18.60 kg/m   Physical Exam  Constitutional: She is oriented to person, place, and time. She appears well-developed and well-nourished. No distress.  HENT:  Head: Normocephalic and atraumatic.  Right Ear: External ear normal.  Left Ear: External ear normal.  Nose: Nose normal.  Mouth/Throat: Oropharynx is clear and moist.  Eyes: Conjunctivae and EOM are normal. Pupils are equal, round, and reactive to light.  Neck: Normal range of motion.  Cardiovascular: Regular rhythm.  Tachycardia present.  Pulmonary/Chest: Effort normal and breath sounds normal.  Abdominal: Soft. Bowel sounds are normal.  Musculoskeletal: Normal range of motion.  Neurological: She is alert and oriented to person, place, and time. She displays normal reflexes. No cranial nerve deficit. Coordination normal.  Skin: Skin is warm and dry. Capillary refill takes less than 2 seconds.  Psychiatric: She has a  normal mood and affect.  Nursing note and vitals reviewed.    ED Treatments / Results  Labs (all labs ordered are listed, but only abnormal results are displayed) Labs Reviewed  CBC WITH DIFFERENTIAL/PLATELET - Abnormal; Notable for the following components:      Result Value   WBC 10.9 (*)    All other components within normal limits  URINALYSIS, ROUTINE W REFLEX MICROSCOPIC  COMPREHENSIVE METABOLIC PANEL  TROPONIN I  LIPASE, BLOOD  CBG MONITORING, ED    EKG  EKG Interpretation  Date/Time:  04-17-17 08:20:20 EST Ventricular Rate:  118 PR Interval:    QRS Duration: 71 QT Interval:  357 QTC Calculation: 501 R Axis:   84 Text Interpretation:  Sinus tachycardia Atrial premature complex Probable left atrial enlargement Borderline right axis deviation Probable left ventricular hypertrophy Prolonged QT interval Confirmed by Pattricia Boss [phone]) on 2017-04-17 8:42:15 AM       Radiology Ct Head Wo Contrast  Result Date: 2017/04/17 CLINICAL DATA:  Headache, nausea, weakness.  Breast cancer. EXAM: CT HEAD WITHOUT CONTRAST TECHNIQUE: Contiguous axial images were obtained from the base of the skull through the vertex without intravenous contrast. COMPARISON:  None. FINDINGS: Brain: No evidence of acute infarction, hemorrhage, hydrocephalus, extra-axial collection or mass lesion/mass effect. Subcortical white matter and periventricular small vessel ischemic changes. Vascular: No hyperdense vessel or unexpected calcification. Skull: Normal. Negative for fracture or focal  lesion. Sinuses/Orbits: The visualized paranasal sinuses are essentially clear. The mastoid air cells are unopacified. Other: None. IMPRESSION: No evidence of acute intracranial abnormality. Small vessel ischemic changes. Electronically Signed   By: Julian Hy M.D.   On: 2017-04-17 10:44   Dg Chest Port 1 View  Result Date: 2017/04/17 CLINICAL DATA:  Long-term smoker. History of COPD, breast malignancy, shortness of breath today. EXAM: PORTABLE CHEST 1 VIEW COMPARISON:  PA and lateral chest x-Race [place] of May 21, 2007 FINDINGS: The lungs are adequately inflated and clear. The cardiac silhouette is mildly enlarged. The pulmonary vascularity is normal. The mediastinum is normal in width. There is calcification in the wall of the thoracic aorta. There are surgical clips in the right infrahilar region likely from previous breast surgery. IMPRESSION: Mild enlargement of cardiac silhouette without pulmonary vascular congestion or pulmonary edema. No acute pneumonia. Thoracic aortic atherosclerosis. Electronically Signed   By: David  Martinique M.D.   On: 04/17/17 08:49   Ct Angio Chest/abd/pel For Dissection W And/or Wo Contrast  Result Date: 04-17-2017 CLINICAL DATA:  Short of breath. EXAM: CT ANGIOGRAPHY CHEST, ABDOMEN AND PELVIS TECHNIQUE: Multidetector CT imaging through the chest, abdomen and pelvis was performed using the standard protocol during bolus administration of intravenous contrast. Multiplanar reconstructed images and MIPs were obtained and reviewed to evaluate the vascular anatomy. CONTRAST:  183mL ISOVUE-370 IOPAMIDOL (ISOVUE-370) INJECTION 76% COMPARISON:  None. FINDINGS: CTA CHEST FINDINGS Cardiovascular: A Stanford type A ascending aortic dissection is present. The false lumen is largely thrombosed consistent with intramural hematoma. There is a small amount of contrast opacification in the false lumen posterior to the ascending aorta. See image 74 of series 6. The adjacent contrast  slightly posterior and to the left is the left atrial appendage. The thrombosed intramural hematoma is somewhat hyperdense on noncontrast images. Overall diameter of the ascending aorta including the thrombosed false lumen measures 5.9 cm. The descending thoracic aorta is not affected by the dissection. Scattered atherosclerotic calcification and plaque is noted in the aortic arch and descending thoracic aorta. There is also hemopericardium  present. The pericardial effusion is small and somewhat hyperdense. The enlarged aorta and pericardial blood does exert some mass effect upon the left atrium likely limiting filling. The great vessels are also unaffected by the dissection there is no evidence of dissection in the great vessels nor is there any evidence of extension of the intramural hematoma. A subtle intramural hematoma in the great vessels cannot entirely be excluded. Vertebral arteries opacified with contrast bilaterally. All 3 coronary arteries opacified with contrast. There is no obvious acute pulmonary thromboembolism. Mediastinum/Nodes: No obviously enlarged lymph nodes are present. Small calcification in the left thyroid without obvious nodule is nonspecific. There is stranding around the great vessels which may represent a small amount of mediastinal hemorrhage. Peribronchovascular soft tissue thickening in the hilar regions is nonspecific. Lungs/Pleura: No pneumothorax. Advanced emphysema. 10 mm irregular and slightly spiculated opacity at the right apex on image 24 of series 7. Scattered linear opacities and ground-glass opacities in the lungs likely reflect volume loss. No pneumothorax. No pleural effusion. Musculoskeletal: Mild wedging of T8 has a chronic appearance. No definite acute rib fracture. No obvious destructive bone lesion in the thorax. Review of the MIP images confirms the above findings. CTA ABDOMEN AND PELVIS FINDINGS VASCULAR Aorta: There is no evidence of aortic dissection in the  abdomen. The aorta is nonaneurysmal and patent with scattered atherosclerotic calcifications and plaque. There is no obvious intramural hematoma in the abdominal aorta. 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 Branch vessels are grossly patent. Accessory left hepatic artery anatomy. SMA: Patent. Renals: Single renal arteries are patent bilaterally. IMA: Severely diminutive but grossly patent. There is no evidence of dissection. Common, internal, and external iliac arteries are nonaneurysmal and patent with scattered atherosclerotic calcification and plaque. Inflow: Visualized femoral vessels are grossly patent and without evidence of dissection. Review of the MIP images confirms the above findings. NON-VASCULAR Hepatobiliary: Liver is unremarkable. Gallbladder is very diminutive and decompressed. Pancreas: Unremarkable Spleen: Unremarkable Adrenals/Urinary Tract: There is scattered scarring in the kidneys bilaterally. There is a calculus in the lower pole of the right kidney measuring 5 mm. 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 Stomach/Bowel: Stomach and duodenum are grossly within normal limits. There is no evidence of small-bowel obstruction. No obvious mass in the colon. Prominent stool burden in the distal sigmoid and rectum. Lymphatic: No abnormal retroperitoneal adenopathy. Reproductive: Uterus and adnexa are within normal limits. Other: No free-fluid. Musculoskeletal: No vertebral compression deformity. Lumbosacral junction is transitional. There is severe disc space narrowing at L5-S1. The S1 vertebral body is lumbarized. Scattered degenerative disc disease in the lumbar spine is present. Review of the MIP images confirms the above findings. IMPRESSION: Vascular: A Stanford type a dissection is present characterized by and intramural hematoma in the ascending aorta. There is a small area of contrast opacification in the false lumen, posterior to the proximal ascending aorta.  The remainder of the false lumen in the ascending aorta is thrombosed and hyperdense. There is no evidence of extension of the dissection into the descending aorta or great vessels. There is an associated hemopericardium. Critical Value/emergent results were called by telephone at the time of interpretation on April 03, 2017 at 11:04 am to Dr. Pattricia Boss , who verbally acknowledged these results. Nonvascular: 10 mm spiculated lung nodule at the right lung apex. There are no prior studies available for comparison. Malignancy is not excluded. PET-CT may be helpful. Right nephrolithiasis. Electronically Signed   By: Marybelle Killings M.D.   On: 2017/04/03 11:05  Procedures Procedures (including critical care time)  Medications Ordered in ED Medications - No data to display   Initial Impression / Assessment and Plan / ED Course  I have reviewed the triage vital signs and the nursing notes.  Pertinent labs & imaging results that were available during my care of the patient were reviewed by me and considered in my medical decision making (see chart for details).    77 y.o. Female co pain with sudden onset headache radiating down through lower abdomen.  She has been borderline hypotensive with sbps in the 80s and presented with tachycardia, improving with iv fluids.  After initial evaluation, cc became focused on upper abdomen.  LFTs elevated, lipase normal.  Currently awaiting ct angio chest and abdomen.  Will [place] upper abdomen if no dissection or other acute etiology of pain identified.   11:01 AM Received call from Dr. Barbie Banner - patient with type 1 dissection.  Dr. Roxan Hockey called and en route.  Discussed with paTIENT and family.  Type and cross sent.  Second iv started.  Current bp 113/81 hr 108  CRITICAL CARE Performed by: Pattricia Boss Total critical care time: 60 minutes Critical care time was exclusive of separately billable procedures and treating other patients. Critical care was necessary to  treat or prevent imminent or life-threatening deterioration. Critical care was time spent personally by me on the following activities: development of treatment plan with patient and/or surrogate as well as nursing, discussions with consultants, evaluation of patient's response to treatment, examination of patient, obtaining history from patient or surrogate, ordering and performing treatments and interventions, ordering and review of laboratory studies, ordering and review of radiographic studies, pulse oximetry and re-evaluation of patient's condition.  Final Clinical Impressions(s) / ED Diagnoses   Final diagnoses:  Ascending aortic aneurysm [facility])    ED Discharge Orders    None       Pattricia Boss, MD 03/31/2017 0037    Pattricia Boss, MD March 31, 2017 [phone]

## 2017-04-11 NOTE — Op Note (Signed)
Kelsey Fuller, Kelsey Fuller                ACCOUNT NO.:  [id number]  MEDICAL RECORD NO.:  [id number]  LOCATION:                                 FACILITY:  PHYSICIAN:  Revonda Standard. Roxan Hockey, M.D. 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  PROCEDURE:  Repair of type 1 aortic dissection with biologic Bentall and open distal anastomosis under deep hypothermic circulatory arrest with antegrade cerebral perfusion.  Coronary artery bypass graft grafting x1 with composite saphenous vein graft and left mammary to LAD.  SURGEON:  Revonda Standard. Roxan Hockey, M.D.  ASSISTANT:  Lars Pinks, PA.  ANESTHESIA:  General.  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  CLINICAL NOTE:  [name] is a 77 year old woman, who presented with acute onset of chest pain and then had pain from the top of her head down to her abdomen.  She became lightheaded, but did not have frank syncope.  She also complained of generalized weakness and nausea.  She came to the emergency room.  She was hypotensive on arrival and tachycardic.  Her lactate was elevated consistent with shock.  She was resuscitated and had a CT, which revealed a type 1 aortic dissection with a pericardial effusion.  She improved with initial resuscitation and was advised to go to the operating room for repair of a type 1 aortic dissection.  The indications, risks, benefits, and alternatives were discussed in detail with the patient and her family.  They understood the high-risk nature of the procedure, but the high likelihood of fatal outcome without surgery.  She accepted the risks and agreed to  proceed.  OPERATIVE NOTE:  [name] was brought to the operating room on 03-30-17.  The Anesthesia Service placed an arterial blood pressure monitoring line.  She then was anesthetized and intubated.  Intravenous antibiotics were administered.  A Swan-Ganz catheter was placed.  A Foley catheter was placed.  There was minimal urine.  TEE was performed. It revealed severe aortic insufficiency with global hypokinesis.  There was pericardial effusion with likely clot posterior to the heart.  The chest, abdomen, and legs were prepped and draped in usual sterile fashion.  An incision was made in the right deltopectoral groove and the right axillary artery was dissected out.  The patient was given 5000 units of heparin.  After the heparin had circulated for 3 minutes, clamps were placed proximally and distally on the axillary artery.  An arteriotomy was made.  An arterial cannula with an attached PTFE graft was anastomosed end-to-side to the axillary artery with a running 5-0 Prolene suture. After completion of the anastomosis, the graft was de-aired and then connected to the bypass circuit.  A median sternotomy was performed.  There was sternal osteoporosis. Retractor was placed.  The pericardium was opened.  There was a large aneurysm with an obvious dissection.  The heart was displaced inferiorly and  to the left side.  While attempting to access the atrium to place the venous cannula, a free rupture occurred posteriorly.  There was massive bleeding.  The patient had been fully heparinized after doing the sternotomy.  All blood that was evacuated was returned via the arterial cannula.  The venous cannula was placed and cardiopulmonary bypass was initiated.  There was continued massive bleeding, which obscured the right atrium.  The cross-clamp was placed on the aorta.  A pursestring suture was placed in the right atrium and the retrograde cardioplegia cannula was advanced into the  coronary sinus and cardioplegia was administered.  There was septal cooling to 9 degrees Celsius.  The left ventricular vent was placed via pursestring suture in the right superior pulmonary vein.  It was noted that there was bleeding from above the cross-clamp.  The cross-clamp was replaced more distally on several occasions, but each time, there continued to be bleeding from the adventitia distal to the clamp. Carbon dioxide was insufflated into the operative filed.  The aorta was transected.  This was a large aneurysmal aorta with a type 1 dissection. Part of the false lumen was clotted.  There was a tear noted in the aorta at the level of the sinotubular junction, which itself was markedly dilated.  No significant atherosclerotic plaque was appreciated.  The dissection extended into the aortic root. The aortic valve appeared relatively normal, but given the setting, the decision was made to proceed with a biologic Bentall replacement using a 21 mm Magna Ease valve and a 24 mm Hemashield graft, the patient was given additional cardioplegia at 20 minute intervals throughout the crossclamp.  The coronary buttons were prepared.   2-0 Ethibond horizontal mattress sutures were placed circumferentially around the aortic annulus.  The sutures then were passed through the sewing ring of the valve and the proximal end of the graft at the same time.  The valve was a 21 mm Magna Ease valve model #3300 TFX, serial [id number].  The sutures then were sequentially tied.  The left coronary button was identified and an arteriotomy was made in the Hemashield graft using thermal cautery.  The left button was anastomosed to the graft with a running 6-0 Prolene suture.  BioGlue was applied around all anastomoses at their completion.  By this point, the patient had cooled to 20 degrees Celsius. Etomidate and steroids were administered.  When the BIS reached 0, the patient was placed in Trendelenburg position.   She was exsanguinated and flow was stopped.  The clamp was removed.  The dissection was seen extending into the aortic arch.  A clamp was placed on the innominate artery and antegrade cerebral perfusion was initiated. Cerebral flows were maintained per protocol.  There was backbleeding from the left coronary and left subclavian ostia.  There were no tears within the aortic arch.  BioGlue was applied between the adventitia and intima in the arch.  The distal anastomosis then was performed to a segment of the 24 mm Hemashield graft that was cut on a bevel to match the size of the aorta.  A Teflon felt pledget was used to buttress the aortic side.  The anastomosis was performed with a running 4-0 Prolene suture.  This was done just below the takeoff of the innominate artery.  After completion of this anastomosis, flow was resumed while de-airing through the graft. A cross-clamp then was applied on the graft.  The total circulatory arrest time was 27 minutes and the antegrade cerebral perfusion  time was 25 minutes.  Full flow was restored and rewarming was initiated.  A graft-to-graft anastomosis then was performed with running 4-0 Prolene suture connecting the proximal and distal portions of the Hemashield graft.  An arteriotomy was made in the graft at the site for the right coronary button again using the thermal cautery and this anastomosis was performed with a running 6-0 Prolene suture as well.  BioGlue was again applied around the anastomosis.  A vent was placed into the graft to assist with de-airing and the aortic crossclamp was removed.  The initial crossclamp time was 131 minutes.  There was bleeding from all the anastomoses while the patient was being rewarmed.  Rewarming was prolonged.  The patient had a low urine output throughout the entire procedure including the rewarming period.  Sutures were used to control any mechanical bleeding, but there continued to be bleeding while  on bypass.  Of note, the patient had profound coagulopathy and thrombocytopenia.  When the patient finally rewarmed to 37 degrees Celsius, an attempt was made to wean from bypass, and initially, she weaned from bypass quite easily.  She was on dopamine and norepinephrine at the time of separation from bypass.  There was bleeding from multiple sites, which required additional sutures.  Test dose of protamine was administered and was well tolerated and the remainder of the protamine was administered.  However, over time, the patient became progressively more hypotensive, and TEE showed worsening septal dyskinesis.  Decision was made to go back on bypass with plan to bypass the LAD in case there was a problem with the left coronary button or dissection of the button.  The patient was re-heparinized.  After ensuring adequate anticoagulation, the ascending graft was cannulated via concentric 2-0 Ethibond pledgeted pursestring sutures.  A dual-stage venous cannula was placed back through the right atrium and cardiopulmonary bypass was re- initiated.  Both groins were explored.  The patient had had previous vein stripping bilaterally.  Small veins were present, but these were not suitable for use as a graft, therefore the left mammary was taken down.  Given the displacement of the heart low and to the left, the mammary was not of adequate length to reach the LAD as a pedicle graft, so a small segment of vein was anastomosed to the mammary with a running 7-0 Prolene suture.  A cardioplegia cannula was placed in the ascending aorta.  The aorta was crossclamped.  Antegrade cardioplegia was administered.  There was rapid septal cooling and rapid diastolic arrest.  The heart then was elevated.  The LAD was opened.  There was no dissection of the LAD. There was some plaque within the LAD, but a probe passed easily proximally and distally.  The distal end of the mammary was anastomosed to the LAD with  a running 8-0 Prolene suture.  The vein portion of the composite graft then was cut to length and beveled and anastomosed to an arteriotomy in the aortic graft with a running 8-0 Prolene suture.  The patient was remained in Trendelenburg position and de-airing was performed through the proximal anastomosis, prior to tying the suture. The aortic crossclamp was removed, the second cross-clamp time was 19 minutes.  A Milrinone load was given and infusion was started.  Norepinephrine and epinephrine drips were administered.  The patient then weaned from cardiopulmonary bypass and initially had better septal function and overall good LV function.  Again, repeat labs showed profound coagulopathy and thrombocytopenia.  After weaning, protamine was administered  and then FFP and platelet administration was initiated along with cryoprecipitate.  There continued to be bleeding.  There also was bleeding from the right ventricle near the atrioventricular groove. Multiple pledgeted sutures were placed in this area.  With the ongoing bleeding and product administration, the patient became more hypotensive. Transesophageal echo showed worsening left ventricular function.  The patient was DDD paced; however, she lost capture and was asystolic underneath.  Attempts to resuscitate with intravenous epinephrine were unsuccessful.  Intracardiac epinephrine was administered and open massage was performed; however, the patient did not have return of spontaneous circulation and could not be captured with a pacemaker.  She was pronounced dead at 9:49 p.m. on 2017-03-30.     Revonda Standard Roxan Hockey, M.D.     SCH/MEDQ  D:  03/24/2017  T:  03/25/2017  Job:  [id number]

## 2017-04-11 DEATH — deceased

## 2017-04-14 MED FILL — Heparin Sodium (Porcine) Inj 1000 Unit/ML: INTRAMUSCULAR | Qty: 10 | Status: AC

## 2017-04-14 MED FILL — Potassium Chloride Inj 10 mEq/100ML: INTRAVENOUS | Qty: 100 | Status: AC

## 2017-04-14 MED FILL — Electrolyte-R (PH 7.4) Solution: INTRAVENOUS | Qty: 9000 | Status: AC

## 2017-04-14 MED FILL — Sodium Bicarbonate IV Soln 8.4%: INTRAVENOUS | Qty: 800 | Status: AC

## 2017-04-14 MED FILL — Albumin, Human Inj 5%: INTRAVENOUS | Qty: 250 | Status: AC

## 2017-04-14 MED FILL — Mannitol IV Soln 20%: INTRAVENOUS | Qty: 500 | Status: AC

## 2017-04-14 MED FILL — Heparin Sodium (Porcine) Inj 1000 Unit/ML: INTRAMUSCULAR | Qty: 30 | Status: AC

## 2017-04-14 MED FILL — Lidocaine HCl IV Inj 20 MG/ML: INTRAVENOUS | Qty: 10 | Status: AC

## 2017-04-14 MED FILL — Sodium Chloride IV Soln 0.9%: INTRAVENOUS | Qty: 6000 | Status: AC

## 2017-04-14 MED FILL — Calcium Chloride Inj 10%: INTRAVENOUS | Qty: 10 | Status: AC

## 2017-04-17 MED FILL — Heparin Sodium (Porcine) Inj 1000 Unit/ML: INTRAMUSCULAR | Qty: 30 | Status: AC

## 2017-04-17 MED FILL — Magnesium Sulfate Inj 50%: INTRAMUSCULAR | Qty: 10 | Status: AC

## 2017-04-17 MED FILL — Potassium Chloride Inj 2 mEq/ML: INTRAVENOUS | Qty: 40 | Status: AC

## 2017-06-22 ENCOUNTER — Ambulatory Visit: Payer: Medicare Other

## 2017-06-22 ENCOUNTER — Ambulatory Visit: Payer: Medicare Other | Admitting: Hematology and Oncology

## 2017-06-22 ENCOUNTER — Other Ambulatory Visit: Payer: Medicare Other

## 2017-07-10 ENCOUNTER — Encounter: Payer: Medicare Other | Admitting: Family Medicine

## 2017-12-20 ENCOUNTER — Ambulatory Visit: Payer: Medicare Other | Admitting: Hematology and Oncology

## 2017-12-20 ENCOUNTER — Other Ambulatory Visit: Payer: Medicare Other

## 2017-12-20 ENCOUNTER — Ambulatory Visit: Payer: Medicare Other

## 2018-01-11 ENCOUNTER — Ambulatory Visit: Payer: Medicare Other | Admitting: Family Medicine
